# Patient Record
Sex: Female | Born: 1970 | Race: Black or African American | Hispanic: No | Marital: Single | Smoking: Never smoker
Health system: Southern US, Community
[De-identification: ages and names within clinical notes are randomized; demographics above are authoritative.]

---

## 2007-05-13 ENCOUNTER — Inpatient Hospital Stay (HOSPITAL_COMMUNITY): Admission: AD | Admit: 2007-05-13 | Discharge: 2007-05-13 | Payer: Self-pay | Admitting: Gynecology

## 2007-10-14 ENCOUNTER — Ambulatory Visit (HOSPITAL_COMMUNITY): Admission: RE | Admit: 2007-10-14 | Discharge: 2007-10-14 | Payer: Self-pay | Admitting: Obstetrics and Gynecology

## 2009-11-09 ENCOUNTER — Ambulatory Visit: Payer: Self-pay | Admitting: Diagnostic Radiology

## 2009-11-09 ENCOUNTER — Emergency Department (HOSPITAL_BASED_OUTPATIENT_CLINIC_OR_DEPARTMENT_OTHER): Admission: EM | Admit: 2009-11-09 | Discharge: 2009-11-09 | Payer: Self-pay | Admitting: Emergency Medicine

## 2010-04-25 ENCOUNTER — Ambulatory Visit (HOSPITAL_COMMUNITY)
Admission: RE | Admit: 2010-04-25 | Discharge: 2010-04-25 | Payer: Self-pay | Source: Home / Self Care | Attending: Internal Medicine | Admitting: Internal Medicine

## 2011-01-22 LAB — WET PREP, GENITAL: Clue Cells Wet Prep HPF POC: NONE SEEN

## 2011-01-22 LAB — URINALYSIS, ROUTINE W REFLEX MICROSCOPIC
Leukocytes, UA: NEGATIVE
Protein, ur: NEGATIVE
Specific Gravity, Urine: 1.025
Urobilinogen, UA: 0.2

## 2011-01-22 LAB — URINE MICROSCOPIC-ADD ON

## 2011-01-22 LAB — HCG, SERUM, QUALITATIVE: Preg, Serum: NEGATIVE

## 2011-01-22 LAB — POCT PREGNANCY, URINE: Preg Test, Ur: NEGATIVE

## 2011-01-22 LAB — GC/CHLAMYDIA PROBE AMP, GENITAL: Chlamydia, DNA Probe: NEGATIVE

## 2014-04-18 LAB — HEMOGLOBIN A1C: HEMOGLOBIN A1C, FINGERSTICK: 5.5

## 2014-04-19 LAB — DRUG SCREEN, URINE: Drug Screen, Urine: NEGATIVE

## 2014-04-19 LAB — OB RESULTS CONSOLE ABO/RH: RH Type: POSITIVE

## 2014-04-19 LAB — OB RESULTS CONSOLE PLATELET COUNT: Platelets: 230 10*3/uL

## 2014-04-19 LAB — OB RESULTS CONSOLE ANTIBODY SCREEN: Antibody Screen: NEGATIVE

## 2014-04-19 LAB — OB RESULTS CONSOLE HGB/HCT, BLOOD
HCT: 32 %
Hemoglobin: 10.4 g/dL

## 2014-04-19 LAB — OB RESULTS CONSOLE HEPATITIS B SURFACE ANTIGEN: Hepatitis B Surface Ag: NEGATIVE

## 2014-04-19 LAB — OB RESULTS CONSOLE RUBELLA ANTIBODY, IGM: Rubella: IMMUNE

## 2014-04-19 LAB — OB RESULTS CONSOLE GC/CHLAMYDIA
Chlamydia: NEGATIVE
Gonorrhea: NEGATIVE

## 2014-04-19 LAB — OB RESULTS CONSOLE HIV ANTIBODY (ROUTINE TESTING): HIV: NONREACTIVE

## 2014-04-19 LAB — OB RESULTS CONSOLE TSH: TSH: 2.007

## 2014-04-19 LAB — OB RESULTS CONSOLE RPR: RPR: NONREACTIVE

## 2014-06-27 ENCOUNTER — Encounter: Payer: Self-pay | Admitting: Physician Assistant

## 2014-07-02 LAB — GLUCOSE TOLERANCE, 1 HOUR (50G) W/O FASTING: GLUCOSE 1 HR PRENATAL, POC: 76 mg/dL

## 2014-07-02 LAB — OB RESULTS CONSOLE VARICELLA ZOSTER ANTIBODY, IGG: Varicella: IMMUNE

## 2014-07-02 LAB — OB RESULTS CONSOLE RPR: RPR: NONREACTIVE

## 2014-07-19 ENCOUNTER — Encounter: Payer: Self-pay | Admitting: Physician Assistant

## 2014-07-19 ENCOUNTER — Ambulatory Visit (INDEPENDENT_AMBULATORY_CARE_PROVIDER_SITE_OTHER): Payer: Medicaid Other | Admitting: Physician Assistant

## 2014-07-19 VITALS — BP 124/74 | HR 75 | Ht 66.0 in | Wt 175.2 lb

## 2014-07-19 DIAGNOSIS — Z9889 Other specified postprocedural states: Secondary | ICD-10-CM

## 2014-07-19 DIAGNOSIS — Z98891 History of uterine scar from previous surgery: Secondary | ICD-10-CM

## 2014-07-19 DIAGNOSIS — O34219 Maternal care for unspecified type scar from previous cesarean delivery: Secondary | ICD-10-CM | POA: Insufficient documentation

## 2014-07-19 DIAGNOSIS — O09523 Supervision of elderly multigravida, third trimester: Secondary | ICD-10-CM

## 2014-07-19 LAB — POCT URINALYSIS DIP (DEVICE)
Bilirubin Urine: NEGATIVE
Glucose, UA: NEGATIVE mg/dL
Ketones, ur: NEGATIVE mg/dL
Leukocytes, UA: NEGATIVE
Nitrite: NEGATIVE
Protein, ur: NEGATIVE mg/dL
Specific Gravity, Urine: 1.02 (ref 1.005–1.030)
Urobilinogen, UA: 0.2 mg/dL (ref 0.0–1.0)
pH: 6.5 (ref 5.0–8.0)

## 2014-07-19 NOTE — Patient Instructions (Signed)
Prenatal Care  WHAT IS PRENATAL CARE?  Prenatal care means health care during your pregnancy, before your baby is born. It is very important to take care of yourself and your baby during your pregnancy by:   Getting early prenatal care. If you know you are pregnant, or think you might be pregnant, call your health care provider as soon as possible. Schedule a visit for a prenatal exam.  Getting regular prenatal care. Follow your health care provider's schedule for blood and other necessary tests. Do not miss appointments.  Doing everything you can to keep yourself and your baby healthy during your pregnancy.  Getting complete care. Prenatal care should include evaluation of the medical, dietary, educational, psychological, and social needs of you and your significant other. The medical and genetic history of your family and the family of your baby's father should be discussed with your health care provider.  Discussing with your health care provider:  Prescription, over-the-counter, and herbal medicines that you take.  Any history of substance abuse, alcohol use, smoking, and illegal drug use.  Any history of domestic abuse and violence.  Immunizations you have received.  Your nutrition and diet.  The amount of exercise you do.  Any environmental and occupational hazards to which you are exposed.  History of sexually transmitted infections for both you and your partner.  Previous pregnancies you have had. WHY IS PRENATAL CARE SO IMPORTANT?  By regularly seeing your health care provider, you help ensure that problems can be identified early so that they can be treated as soon as possible. Other problems might be prevented. Many studies have shown that early and regular prenatal care is important for the health of mothers and their babies.  HOW CAN I TAKE CARE OF MYSELF WHILE I AM PREGNANT?  Here are ways to take care of yourself and your baby:   Start or continue taking your  multivitamin with 400 micrograms (mcg) of folic acid every day.  Get early and regular prenatal care. It is very important to see a health care provider during your pregnancy. Your health care provider will check at each visit to make sure that you and your baby are healthy. If there are any problems, action can be taken right away to help you and your baby.  Eat a healthy diet that includes:  Fruits.  Vegetables.  Foods low in saturated fat.  Whole grains.  Calcium-rich foods, such as milk, yogurt, and hard cheeses.  Drink 6-8 glasses of liquids a day.  Unless your health care provider tells you not to, try to be physically active for 30 minutes, most days of the week. If you are pressed for time, you can get your activity in through 10-minute segments, three times a day.  Do not smoke, drink alcohol, or use drugs. These can cause long-term damage to your baby. Talk with your health care provider about steps to take to stop smoking. Talk with a member of your faith community, a counselor, a trusted friend, or your health care provider if you are concerned about your alcohol or drug use.  Ask your health care provider before taking any medicine, even over-the-counter medicines. Some medicines are not safe to take during pregnancy.  Get plenty of rest and sleep.  Avoid hot tubs and saunas during pregnancy.  Do not have X-rays taken unless absolutely necessary and with the recommendation of your health care provider. A lead shield can be placed on your abdomen to protect your baby when   X-rays are taken in other parts of your body.  Do not empty the cat litter when you are pregnant. It may contain a parasite that causes an infection called toxoplasmosis, which can cause birth defects. Also, use gloves when working in garden areas used by cats.  Do not eat uncooked or undercooked meats or fish.  Do not eat soft, mold-ripened cheeses (Brie, Camembert, and chevre) or soft, blue-veined  cheese (Danish blue and Roquefort).  Stay away from toxic chemicals like:  Insecticides.  Solvents (some cleaners or paint thinners).  Lead.  Mercury.  Sexual intercourse may continue until the end of the pregnancy, unless you have a medical problem or there is a problem with the pregnancy and your health care provider tells you not to.  Do not wear high-heel shoes, especially during the second half of the pregnancy. You can lose your balance and fall.  Do not take long trips, unless absolutely necessary. Be sure to see your health care provider before going on the trip.  Do not sit in one position for more than 2 hours when on a trip.  Take a copy of your medical records when going on a trip. Know where a hospital is located in the city you are visiting, in case of an emergency.  Most dangerous household products will have pregnancy warnings on their labels. Ask your health care provider about products if you are unsure.  Limit or eliminate your caffeine intake from coffee, tea, sodas, medicines, and chocolate.  Many women continue working through pregnancy. Staying active might help you stay healthier. If you have a question about the safety or the hours you work at your particular job, talk with your health care provider.  Get informed:  Read books.  Watch videos.  Go to childbirth classes for you and your significant other.  Talk with experienced moms.  Ask your health care provider about childbirth education classes for you and your partner. Classes can help you and your partner prepare for the birth of your baby.  Ask about a baby doctor (pediatrician) and methods and pain medicine for labor, delivery, and possible cesarean delivery. HOW OFTEN SHOULD I SEE MY HEALTH CARE PROVIDER DURING PREGNANCY?  Your health care provider will give you a schedule for your prenatal visits. You will have visits more often as you get closer to the end of your pregnancy. An average  pregnancy lasts about 40 weeks.  A typical schedule includes visiting your health care provider:   About once each month during your first 6 months of pregnancy.  Every 2 weeks during the next 2 months.  Weekly in the last month, until the delivery date. Your health care provider will probably want to see you more often if:  You are older than 35 years.  Your pregnancy is high risk because you have certain health problems or problems with the pregnancy, such as:  Diabetes.  High blood pressure.  The baby is not growing on schedule, according to the dates of the pregnancy. Your health care provider will do special tests to make sure you and your baby are not having any serious problems. WHAT HAPPENS DURING PRENATAL VISITS?   At your first prenatal visit, your health care provider will do a physical exam and talk to you about your health history and the health history of your partner and your family. Your health care provider will be able to tell you what date to expect your baby to be born on.  Your   first physical exam will include checks of your blood pressure, measurements of your height and weight, and an exam of your pelvic organs. Your health care provider will do a Pap test if you have not had one recently and will do cultures of your cervix to make sure there is no infection.  At each prenatal visit, there will be tests of your blood, urine, blood pressure, weight, and the progress of the baby will be checked.  At your later prenatal visits, your health care provider will check how you are doing and how your baby is developing. You may have a number of tests done as your pregnancy progresses.  Ultrasound exams are often used to check on your baby's growth and health.  You may have more urine and blood tests, as well as special tests, if needed. These may include amniocentesis to examine fluid in the pregnancy sac, stress tests to check how the baby responds to contractions, or a  biophysical profile to measure your baby's well-being. Your health care provider will explain the tests and why they are necessary.  You should be tested for high blood sugar (gestational diabetes) between the 24th and 28th weeks of your pregnancy.  You should discuss with your health care provider your plans to breastfeed or bottle-feed your baby.  Each visit is also a chance for you to learn about staying healthy during pregnancy and to ask questions. Document Released: 04/23/2003 Document Revised: 04/25/2013 Document Reviewed: 07/05/2013 Exit[employer] Patient Information 2015 [employer]. This information is not intended to replace advice given to you by your health care provider. Make sure you discuss any questions you have with your health care provider.  

## 2014-07-19 NOTE — Progress Notes (Signed)
Initial visit-- patient reports being seen by Dr. Micah Noel and the [facility] Department-- states they did all blood work, pap smear and U/S along with glucose test; left there because she was unhappy with Dr. Micah Noel. Patient to sign ROI to request records.  New OB packet and 28 week packet given.

## 2014-07-19 NOTE — Progress Notes (Signed)
  Subjective:    Brooke Hampton is a G2P1001 [redacted]w[redacted]d being seen today for her first obstetrical visit.  Her obstetrical history is significant for advanced maternal age. Patient does intend to breast feed. Pregnancy history fully reviewed.  Patient reports no complaints.  Filed Vitals:   07/19/14 1323 07/19/14 1325  BP: 124/74   Pulse: 75   Height:  5\' 6"  (1.676 m)  Weight: 175 lb 3.2 oz (79.47 kg)     HISTORY: OB History  Gravida Para Term Preterm AB SAB TAB Ectopic Multiple Living  2 1 1  0 0     1    # Outcome Date GA Lbr Len/2nd Weight Sex Delivery Anes PTL Lv  2 Current           1 Term  [redacted]w[redacted]d  7 lb 7 oz (3.374 kg)  CS-Unspec        History reviewed. No pertinent past medical history. Past Surgical History  Procedure Laterality Date  . Cesarean section     History reviewed. No pertinent family history.   Exam    Uterus:   gravid                                 System:     Skin: normal coloration and turgor, no rashes    Neurologic: oriented, normal mood, grossly non-focal   Extremities: normal strength, tone, and muscle mass   HEENT extra ocular movement intact   Mouth/Teeth mucous membranes moist, pharynx normal without lesions and dental hygiene good   Neck supple and no masses   Cardiovascular: regular rate and rhythm, no murmurs or gallops   Respiratory:  appears well, vitals normal, no respiratory distress, acyanotic, normal RR, ear and throat exam is normal, neck free of mass or lymphadenopathy, chest clear, no wheezing, crepitations, rhonchi, normal symmetric air entry   Abdomen: soft, non-tender; bowel sounds normal; no masses,  no organomegaly          Assessment:    Pregnancy: G2P1001 Patient Active Problem List   Diagnosis Date Noted  . Supervision of high risk elderly multigravida in third trimester 07/19/2014  . H/O cesarean section 07/19/2014        Plan:    Desires TOLAC.  Consent reviewed and signed. Initial labs  drawn. Prenatal vitamins. Problem list reviewed and updated. Genetic Screening discussed Quad Screen: ordered.  Ultrasound discussed; fetal survey: ordered.  Follow up in 2 weeks. 90% of 45 min visit spent on counseling and coordination of care.   Records from early in pregnancy available from Dr. Micah Noel.  Still awaiting records from HD.    Paticia Stack 07/19/2014

## 2014-07-20 ENCOUNTER — Encounter: Payer: Self-pay | Admitting: *Deleted

## 2014-07-25 ENCOUNTER — Encounter: Payer: Self-pay | Admitting: *Deleted

## 2014-07-26 ENCOUNTER — Inpatient Hospital Stay (HOSPITAL_COMMUNITY)
Admission: AD | Admit: 2014-07-26 | Discharge: 2014-07-30 | DRG: 765 | Disposition: A | Discharge status: Home / Self Care | Payer: Medicaid Other | Source: Ambulatory Visit | Attending: Family Medicine | Admitting: Family Medicine

## 2014-07-26 ENCOUNTER — Other Ambulatory Visit: Payer: Self-pay | Admitting: Physician Assistant

## 2014-07-26 ENCOUNTER — Encounter: Payer: Self-pay | Admitting: Family Medicine

## 2014-07-26 ENCOUNTER — Encounter (HOSPITAL_COMMUNITY): Payer: Self-pay | Admitting: General Practice

## 2014-07-26 ENCOUNTER — Ambulatory Visit (HOSPITAL_COMMUNITY)
Admission: RE | Admit: 2014-07-26 | Discharge: 2014-07-26 | Disposition: A | Discharge status: Home / Self Care | Payer: Self-pay | Source: Ambulatory Visit | Attending: Physician Assistant | Admitting: Physician Assistant

## 2014-07-26 DIAGNOSIS — O09523 Supervision of elderly multigravida, third trimester: Secondary | ICD-10-CM

## 2014-07-26 DIAGNOSIS — O365930 Maternal care for other known or suspected poor fetal growth, third trimester, not applicable or unspecified: Secondary | ICD-10-CM | POA: Insufficient documentation

## 2014-07-26 DIAGNOSIS — O09529 Supervision of elderly multigravida, unspecified trimester: Secondary | ICD-10-CM | POA: Insufficient documentation

## 2014-07-26 DIAGNOSIS — Z3A31 31 weeks gestation of pregnancy: Secondary | ICD-10-CM | POA: Insufficient documentation

## 2014-07-26 DIAGNOSIS — Z3689 Encounter for other specified antenatal screening: Secondary | ICD-10-CM | POA: Insufficient documentation

## 2014-07-26 DIAGNOSIS — O365990 Maternal care for other known or suspected poor fetal growth, unspecified trimester, not applicable or unspecified: Secondary | ICD-10-CM | POA: Diagnosis present

## 2014-07-26 DIAGNOSIS — IMO0002 Reserved for concepts with insufficient information to code with codable children: Secondary | ICD-10-CM

## 2014-07-26 DIAGNOSIS — O365931 Maternal care for other known or suspected poor fetal growth, third trimester, fetus 1: Secondary | ICD-10-CM | POA: Diagnosis not present

## 2014-07-26 DIAGNOSIS — O3413 Maternal care for benign tumor of corpus uteri, third trimester: Secondary | ICD-10-CM | POA: Diagnosis present

## 2014-07-26 DIAGNOSIS — O4103X0 Oligohydramnios, third trimester, not applicable or unspecified: Secondary | ICD-10-CM | POA: Diagnosis present

## 2014-07-26 DIAGNOSIS — D259 Leiomyoma of uterus, unspecified: Secondary | ICD-10-CM | POA: Diagnosis present

## 2014-07-26 DIAGNOSIS — O4100X0 Oligohydramnios, unspecified trimester, not applicable or unspecified: Secondary | ICD-10-CM | POA: Insufficient documentation

## 2014-07-26 DIAGNOSIS — O34219 Maternal care for unspecified type scar from previous cesarean delivery: Secondary | ICD-10-CM | POA: Diagnosis present

## 2014-07-26 DIAGNOSIS — O3421 Maternal care for scar from previous cesarean delivery: Secondary | ICD-10-CM | POA: Diagnosis present

## 2014-07-26 DIAGNOSIS — O288 Other abnormal findings on antenatal screening of mother: Secondary | ICD-10-CM

## 2014-07-26 LAB — GROUP B STREP BY PCR: Group B strep by PCR: POSITIVE — AB

## 2014-07-26 LAB — CBC
HEMATOCRIT: 35.4 % — AB (ref 36.0–46.0)
Hemoglobin: 11.8 g/dL — ABNORMAL LOW (ref 12.0–15.0)
MCH: 29.5 pg (ref 26.0–34.0)
MCHC: 33.3 g/dL (ref 30.0–36.0)
MCV: 88.5 fL (ref 78.0–100.0)
Platelets: 191 10*3/uL (ref 150–400)
RBC: 4 MIL/uL (ref 3.87–5.11)
RDW: 13.4 % (ref 11.5–15.5)
WBC: 6.3 10*3/uL (ref 4.0–10.5)

## 2014-07-26 LAB — TYPE AND SCREEN
ABO/RH(D): A POS
Antibody Screen: NEGATIVE

## 2014-07-26 LAB — ABO/RH: ABO/RH(D): A POS

## 2014-07-26 LAB — OB RESULTS CONSOLE GBS: GBS: POSITIVE

## 2014-07-26 MED ORDER — ZOLPIDEM TARTRATE 5 MG PO TABS: 5.0000 mg | ORAL_TABLET | Freq: Every evening | ORAL | Status: DC | PRN

## 2014-07-26 MED ORDER — LIDOCAINE HCL 1 % IJ SOLN
INTRAMUSCULAR | Status: AC
  Filled 2014-07-26: qty 20

## 2014-07-26 MED ORDER — DOCUSATE SODIUM 100 MG PO CAPS: 100.0000 mg | ORAL_CAPSULE | Freq: Every day | ORAL | Status: DC

## 2014-07-26 MED ORDER — CALCIUM CARBONATE ANTACID 500 MG PO CHEW
2.0000 | CHEWABLE_TABLET | ORAL | Status: DC | PRN
  Administered 2014-07-26 – 2014-07-27 (×2): 400 mg via ORAL
  Filled 2014-07-26 (×2): qty 1

## 2014-07-26 MED ORDER — PRENATAL MULTIVITAMIN CH: 1.0000 | ORAL_TABLET | Freq: Every day | ORAL | Status: DC

## 2014-07-26 MED ORDER — BETAMETHASONE SOD PHOS & ACET 6 (3-3) MG/ML IJ SUSP
12.0000 mg | INTRAMUSCULAR | Status: DC
  Administered 2014-07-26: 12 mg via INTRAMUSCULAR
  Filled 2014-07-26 (×2): qty 2

## 2014-07-26 MED ORDER — ACETAMINOPHEN 325 MG PO TABS: 650.0000 mg | ORAL_TABLET | ORAL | Status: DC | PRN

## 2014-07-26 MED ORDER — LACTATED RINGERS IV SOLN
INTRAVENOUS | Status: DC
  Administered 2014-07-26 – 2014-07-27 (×7): via INTRAVENOUS

## 2014-07-26 MED ORDER — PRENATAL MULTIVITAMIN CH
1.0000 | ORAL_TABLET | Freq: Every day | ORAL | Status: DC
  Administered 2014-07-26 – 2014-07-27 (×2): 1 via ORAL
  Filled 2014-07-26 (×2): qty 1

## 2014-07-26 NOTE — H&P (Addendum)
  FACULTY PRACTICE ANTEPARTUM ADMISSION HISTORY AND PHYSICAL NOTE   History of Present Illness: Zi Sek is a 44 y.o. G2P1001 at [redacted]w[redacted]d admitted for IUGR and AEDF with oligohydramnios.  Patient reports the fetal movement as active. Patient reports uterine contraction  activity as none. Patient reports  vaginal bleeding as none. Patient describes fluid per vagina as None. Fetal presentation is cephalic.  Patient Active Problem List   Diagnosis Date Noted  . IUGR (intrauterine growth restriction) affecting care of mother 07/26/2014  . Advanced maternal age in multigravida   . Poor fetal growth affecting management of mother in third trimester, antepartum   . Supervision of high risk elderly multigravida in third trimester 07/19/2014  . H/O cesarean section 07/19/2014    No past medical history on file.  Past Surgical History  Procedure Laterality Date  . Cesarean section      OB History  Gravida Para Term Preterm AB SAB TAB Ectopic Multiple Living  2 1 1  0 0     1    # Outcome Date GA Lbr Len/2nd Weight Sex Delivery Anes PTL Lv  2 Current           1 Term  [redacted]w[redacted]d  7 lb 7 oz (3.374 kg)  CS-Unspec         History   Social History  . Marital Status: Married    Spouse Name: N/A  . Number of Children: N/A  . Years of Education: N/A   Social History Main Topics  . Smoking status: Never Smoker   . Smokeless tobacco: Never Used  . Alcohol Use: No  . Drug Use: No  . Sexual Activity: Yes   Other Topics Concern  . Not on file   Social History Narrative  . No narrative on file    No family history on file.  No Known Allergies  Prescriptions prior to admission  Medication Sig Dispense Refill Last Dose  . prenatal vitamin w/FE, FA (PRENATAL 1 + 1) 27-1 MG TABS tablet Take 1 tablet by mouth daily at 12 noon.   Taking    Review of Systems - 14 point review of systems is negative.  Vitals:  LMP 12/19/2013 Physical Examination: GENERAL: Well-developed,  well-nourished female in no acute distress.  LUNGS: normal effort HEART: Regular rate and rhythm. ABDOMEN: Gravid, non-tender.  Well healed transverse incision EXTREMITIES: No cyanosis, clubbing, or edema.  Tocometer: Flat  Labs:  No results found for this or any previous visit (from the past 24 hour(s)).  Imaging Studies: Vtx, AFI 5.8, EFW 2 lb 13 oz 18%-biometry is < 5 %, persisitent AEDF, multiple fibroids, possible placental infarct. Normal appearing anatomy, though limited.  Assessment and Plan: Patient Active Problem List   Diagnosis Date Noted  . IUGR (intrauterine growth restriction) affecting care of mother 07/26/2014  . Advanced maternal age in multigravida   . Poor fetal growth affecting management of mother in third trimester, antepartum   . Supervision of high risk elderly multigravida in third trimester 07/19/2014  . H/O cesarean section 07/19/2014   Admit to Antenatal Betamethasone x 2 doses Repeat Dopplers tomorrow with MFM NICU consult IVF's Pt. Initially wanted TOLAC, but fetal status may require RCS. If delivery seems imminent, would begin Magnesium Sulfate for CP prophylaxis. Continuous fetal monitoring. Routine antenatal care    PRATT,TANYA S 07/26/2014 12:17 PM

## 2014-07-27 ENCOUNTER — Observation Stay (HOSPITAL_COMMUNITY): Payer: Medicaid Other

## 2014-07-27 DIAGNOSIS — O365930 Maternal care for other known or suspected poor fetal growth, third trimester, not applicable or unspecified: Principal | ICD-10-CM

## 2014-07-27 DIAGNOSIS — O365931 Maternal care for other known or suspected poor fetal growth, third trimester, fetus 1: Secondary | ICD-10-CM | POA: Diagnosis not present

## 2014-07-27 DIAGNOSIS — O288 Other abnormal findings on antenatal screening of mother: Secondary | ICD-10-CM

## 2014-07-27 DIAGNOSIS — O289 Unspecified abnormal findings on antenatal screening of mother: Secondary | ICD-10-CM

## 2014-07-27 DIAGNOSIS — Z3A31 31 weeks gestation of pregnancy: Secondary | ICD-10-CM

## 2014-07-27 MED ORDER — MAGNESIUM SULFATE 40 G IN LACTATED RINGERS - SIMPLE
2.0000 g/h | INTRAVENOUS | Status: DC
  Filled 2014-07-27: qty 500

## 2014-07-27 MED ORDER — BETAMETHASONE SOD PHOS & ACET 6 (3-3) MG/ML IJ SUSP
12.0000 mg | INTRAMUSCULAR | Status: AC
  Administered 2014-07-27: 12 mg via INTRAMUSCULAR
  Filled 2014-07-27: qty 2

## 2014-07-27 MED ORDER — MAGNESIUM SULFATE BOLUS VIA INFUSION
4.0000 g | Freq: Once | INTRAVENOUS | Status: AC
  Administered 2014-07-27: 4 g via INTRAVENOUS
  Filled 2014-07-27: qty 500

## 2014-07-27 NOTE — Progress Notes (Signed)
MD in to talk to pt about plan of care

## 2014-07-27 NOTE — Progress Notes (Signed)
Pt continues in the bathroom completing her AM care

## 2014-07-27 NOTE — Progress Notes (Signed)
Patient ID: Brooke Hampton, female   DOB: 1970-06-23, 44 y.o.   MRN: [id number] [place]) NOTE  Brooke Hampton is a 44 y.o. G2P1001 at [redacted]w[redacted]d  who is admitted for bed rest secondary to IUGR with abnormal dopplers.    Fetal presentation is cephalic. Length of Stay:  1  Days  Date of admission:07/26/2014  Subjective: Patient reports feeling well without complaints.  Patient reports the fetal movement as active. Patient reports uterine contraction  activity as none. Patient reports  vaginal bleeding as none. Patient describes fluid per vagina as None.  Vitals:  Blood pressure 142/83, pulse 69, temperature 98.4 F (36.9 C), temperature source Oral, resp. rate 18, height 5\' 6"  (1.676 m), weight 175 lb (79.379 kg), last menstrual period 12/19/2013. Filed Vitals:   07/26/14 1226 07/26/14 1657 07/26/14 2004  BP: 122/62 131/85 142/83  Pulse: 64 73 69  Temp: 98.3 F (36.8 C) 98.4 F (36.9 C)   TempSrc: Oral Oral   Resp: 18 18 18   Height: 5\' 6"  (1.676 m)    Weight: 175 lb (79.379 kg)     Physical Examination:  General appearance - alert, well appearing, and in no distress Fundal Height:  size less than dates Pelvic Exam:  examination not indicated Cervical Exam: Not evaluated.  Extremities: extremities normal, atraumatic, no cyanosis or edema and Homans sign is negative, no sign of DVT with DTRs 2+ bilaterally Membranes:intact  Fetal Monitoring:  Baseline: 155 bpm, Variability: Good {> 6 bpm), Accelerations: Non-reactive but appropriate for gestational age, Decelerations: Variable: moderate spontaneous decels with return to baseline  and Toco: no contractions     Labs:  Results for orders placed or performed during the hospital encounter of 07/26/14 (from the past 24 hour(s))  Group B strep by PCR   Collection Time: 07/26/14 12:55 PM  Result Value Ref Range   Group B strep by PCR POSITIVE (A) NEGATIVE  CBC on admission   Collection Time: 07/26/14  1:10 PM   Result Value Ref Range   WBC 6.3 4.0 - 10.5 K/uL   RBC 4.00 3.87 - 5.11 MIL/uL   Hemoglobin 11.8 (L) 12.0 - 15.0 g/dL   HCT 35.4 (L) 36.0 - 46.0 %   MCV 88.5 78.0 - 100.0 fL   MCH 29.5 26.0 - 34.0 pg   MCHC 33.3 30.0 - 36.0 g/dL   RDW 13.4 11.5 - 15.5 %   Platelets 191 150 - 400 K/uL  Type and screen   Collection Time: 07/26/14  1:10 PM  Result Value Ref Range   ABO/RH(D) A POS    Antibody Screen NEG    Sample Expiration 07/29/2014   ABO/Rh   Collection Time: 07/26/14  1:10 PM  Result Value Ref Range   ABO/RH(D) A POS   OB RESULT CONSOLE Group B Strep   Collection Time: 07/26/14  6:59 PM  Result Value Ref Range   GBS Positive     Imaging Studies:    EFW 18%tile 1325 gm with persistent AEDF  Medications:  Scheduled . betamethasone acetate-betamethasone sodium phosphate  12 mg Intramuscular Q24H  . docusate sodium  100 mg Oral Daily  . prenatal multivitamin  1 tablet Oral QHS   I have reviewed the patient's current medications.  ASSESSMENT: G2P1001 [redacted]w[redacted]d Estimated Date of Delivery: 09/25/14  Patient Active Problem List   Diagnosis Date Noted  . IUGR (intrauterine growth restriction) affecting care of mother 07/26/2014  . Oligohydramnios antepartum 07/26/2014  . Advanced maternal age in multigravida   .  Supervision of high risk elderly multigravida in third trimester 07/19/2014  . Previous cesarean delivery affecting pregnancy, antepartum 07/19/2014    PLAN: 44 yo G2P1 at [redacted]w[redacted]d admitted with IUGR, oligo, and abnormal dopplers - Patient to receive second dose of BMZ today around 1pm - Follow up umbilical dopplers today - Continue close monitoring and current care - A total of 30 minutes was spent with the patient this morning explaining the plan of care  Benno Brensinger 07/27/2014,6:53 AM

## 2014-07-27 NOTE — Progress Notes (Signed)
Dr. Sarita Haver neonatologist at bedside  for consult

## 2014-07-27 NOTE — Consult Note (Signed)
Neonatology Consult to Antenatal Patient:  I was asked by Dr. Nehemiah Settle to see this patient in order to provide antenatal counseling due to FHR deceleration associated with intermittent REDF.  Brooke Hampton was admitted yesterday and is now 54 3/[redacted] weeks GA. She was admitted due to absent EDF, IUGR, and oligohydramnios. EFW is 1325 grams. The infant is a female. She is getting Betamethasone with the second dose due today at 1300. She is having intermittent REDF today, BPP 4/8, and a non-reassuring fetal monitor strip. A C-section has been recommended, but the patient reportedly does not want to do this. She has been given neuroprotective Magnesium sulfate this morning. There has been no labor.  I spoke with the patient and her husband. We discussed what they could expect if the baby is delivered today, including usual DR management, possible respiratory complications and need for support, IV access, feedings (mother desires breast and formula feeding), LOS, Mortality and Morbidity, and long term outcomes. I stressed the likelihood that the baby's outcome would be better with delivery now than to remain in the current in utero environment. They did not have any questions at this time. I offered a NICU tour to the father and would be glad to come back if they have more questions later.  Thank you for asking me to see this patient.  Real Cons, MD Neonatologist  The total length of face-to-face or floor/unit time for this encounter was 25 minutes. Counseling and/or coordination of care was 15 minutes of the above.

## 2014-07-27 NOTE — Progress Notes (Signed)
Spoke with patient - still having decelerations, though not as frequent.  Variability intermittently minimal to moderate.  No accelerations seen.  Discussed with the patient that this is still concerning.    I discussed that it was still my recommendation to deliver today, but patient still declining cesarean delivery.  Discontinue magnesium sulfate.  Will allow patient to eat.    Continue with continuous monitoring.  Truett Mainland, DO 07/27/2014 6:22 PM

## 2014-07-27 NOTE — Progress Notes (Addendum)
Patient ID: Brooke Hampton, female   DOB: 1970/11/11, 44 y.o.   MRN: [id number]  Talked with patient and her husband regarding BPP and umbilical artery dopplers.  Explained that due to absent and reverse end diastolic flow, combined with a BPP 4/8, and repetitive decelerations, the recommendation is to deliver due to the high risk of still birth.  The patient acknowledged the risk, but responded that she did not want to have the baby today.  She also said that she understood the severity of what was going on, but that she wanted to go home and talk with her family about this decision and then would return for delivery.  I explained that she would have to sign out against medical advise and that she should remain on continuous monitoring until she delivered.  I repeated that with the severity of the baby's status, I recommend proceeding to deliver via cesarean as soon as possible.    Will give the second dose of betamethasone early and start magnesium sulfate for neuro-protection.  NICU to come and talk with the patient and her husband.  Patient would like time to think about this.  Continuous monitoring.  Will f/u with patient later this morning.

## 2014-07-27 NOTE — Progress Notes (Signed)
Pt up and to bathroom, instructed pt that she needs to get to MFM in a timely manner and not off the monitor long.  Pt informed of FHR decels and the importance of monitoring FHR

## 2014-07-27 NOTE — Progress Notes (Signed)
Pt stating that she is not going to deliver at this time and requests to speak with her MD. Nehemiah Settle notified and will come to discuss plan of care.

## 2014-07-28 ENCOUNTER — Encounter (HOSPITAL_COMMUNITY)
Admission: AD | Disposition: A | Discharge status: Home / Self Care | Payer: Self-pay | Source: Ambulatory Visit | Attending: Family Medicine

## 2014-07-28 ENCOUNTER — Encounter (HOSPITAL_COMMUNITY): Payer: Self-pay | Admitting: Obstetrics & Gynecology

## 2014-07-28 ENCOUNTER — Observation Stay (HOSPITAL_COMMUNITY): Payer: Medicaid Other | Admitting: Anesthesiology

## 2014-07-28 DIAGNOSIS — O365930 Maternal care for other known or suspected poor fetal growth, third trimester, not applicable or unspecified: Secondary | ICD-10-CM | POA: Diagnosis present

## 2014-07-28 DIAGNOSIS — O09523 Supervision of elderly multigravida, third trimester: Secondary | ICD-10-CM | POA: Diagnosis not present

## 2014-07-28 DIAGNOSIS — Z3A31 31 weeks gestation of pregnancy: Secondary | ICD-10-CM | POA: Diagnosis present

## 2014-07-28 DIAGNOSIS — O3421 Maternal care for scar from previous cesarean delivery: Secondary | ICD-10-CM | POA: Diagnosis not present

## 2014-07-28 DIAGNOSIS — O4103X0 Oligohydramnios, third trimester, not applicable or unspecified: Secondary | ICD-10-CM | POA: Diagnosis present

## 2014-07-28 DIAGNOSIS — O3413 Maternal care for benign tumor of corpus uteri, third trimester: Secondary | ICD-10-CM | POA: Diagnosis present

## 2014-07-28 DIAGNOSIS — D259 Leiomyoma of uterus, unspecified: Secondary | ICD-10-CM | POA: Diagnosis present

## 2014-07-28 SURGERY — Surgical Case
Anesthesia: Spinal

## 2014-07-28 MED ORDER — KETOROLAC TROMETHAMINE 30 MG/ML IJ SOLN
30.0000 mg | Freq: Four times a day (QID) | INTRAMUSCULAR | Status: DC | PRN
  Administered 2014-07-28: 30 mg via INTRAVENOUS

## 2014-07-28 MED ORDER — ZOLPIDEM TARTRATE 5 MG PO TABS: 5.0000 mg | ORAL_TABLET | Freq: Every evening | ORAL | Status: DC | PRN

## 2014-07-28 MED ORDER — KETOROLAC TROMETHAMINE 30 MG/ML IJ SOLN: 30.0000 mg | Freq: Four times a day (QID) | INTRAMUSCULAR | Status: DC | PRN

## 2014-07-28 MED ORDER — FENTANYL CITRATE 0.05 MG/ML IJ SOLN
INTRAMUSCULAR | Status: AC
  Filled 2014-07-28: qty 2

## 2014-07-28 MED ORDER — DEXTROSE 5 % IV SOLN
2.0000 g | INTRAVENOUS | Status: AC
  Administered 2014-07-28: 2 g via INTRAVENOUS
  Filled 2014-07-28: qty 2

## 2014-07-28 MED ORDER — PRENATAL MULTIVITAMIN CH
1.0000 | ORAL_TABLET | Freq: Every day | ORAL | Status: DC
  Administered 2014-07-29 – 2014-07-30 (×2): 1 via ORAL
  Filled 2014-07-28 (×2): qty 1

## 2014-07-28 MED ORDER — SODIUM CHLORIDE 0.9 % IJ SOLN: 3.0000 mL | INTRAMUSCULAR | Status: DC | PRN

## 2014-07-28 MED ORDER — DIPHENHYDRAMINE HCL 25 MG PO CAPS
25.0000 mg | ORAL_CAPSULE | ORAL | Status: DC | PRN
  Filled 2014-07-28: qty 1

## 2014-07-28 MED ORDER — IBUPROFEN 600 MG PO TABS
600.0000 mg | ORAL_TABLET | Freq: Four times a day (QID) | ORAL | Status: DC
  Administered 2014-07-28 – 2014-07-30 (×7): 600 mg via ORAL
  Filled 2014-07-28 (×7): qty 1

## 2014-07-28 MED ORDER — DIPHENHYDRAMINE HCL 50 MG/ML IJ SOLN
12.5000 mg | INTRAMUSCULAR | Status: DC | PRN
  Administered 2014-07-28: 12.5 mg via INTRAVENOUS

## 2014-07-28 MED ORDER — MORPHINE SULFATE (PF) 0.5 MG/ML IJ SOLN
INTRAMUSCULAR | Status: DC | PRN
  Administered 2014-07-28: .1 mg via INTRATHECAL

## 2014-07-28 MED ORDER — SIMETHICONE 80 MG PO CHEW
80.0000 mg | CHEWABLE_TABLET | ORAL | Status: DC
  Administered 2014-07-30: 80 mg via ORAL
  Filled 2014-07-28 (×2): qty 1

## 2014-07-28 MED ORDER — CITRIC ACID-SODIUM CITRATE 334-500 MG/5ML PO SOLN
ORAL | Status: AC
  Administered 2014-07-28: 30 mL
  Filled 2014-07-28: qty 15

## 2014-07-28 MED ORDER — PHENYLEPHRINE 8 MG IN D5W 100 ML (0.08MG/ML) PREMIX OPTIME
INJECTION | INTRAVENOUS | Status: AC
  Filled 2014-07-28: qty 100

## 2014-07-28 MED ORDER — KETOROLAC TROMETHAMINE 30 MG/ML IJ SOLN
INTRAMUSCULAR | Status: AC
  Administered 2014-07-28: 30 mg via INTRAVENOUS
  Filled 2014-07-28: qty 1

## 2014-07-28 MED ORDER — WITCH HAZEL-GLYCERIN EX PADS: 1.0000 "application " | MEDICATED_PAD | CUTANEOUS | Status: DC | PRN

## 2014-07-28 MED ORDER — NALOXONE HCL 0.4 MG/ML IJ SOLN: 0.4000 mg | INTRAMUSCULAR | Status: DC | PRN

## 2014-07-28 MED ORDER — OXYCODONE-ACETAMINOPHEN 5-325 MG PO TABS
2.0000 | ORAL_TABLET | ORAL | Status: DC | PRN
  Administered 2014-07-30: 2 via ORAL
  Filled 2014-07-28: qty 2

## 2014-07-28 MED ORDER — SIMETHICONE 80 MG PO CHEW
80.0000 mg | CHEWABLE_TABLET | ORAL | Status: DC | PRN
  Administered 2014-07-29: 80 mg via ORAL
  Filled 2014-07-28: qty 1

## 2014-07-28 MED ORDER — FENTANYL CITRATE 0.05 MG/ML IJ SOLN
INTRAMUSCULAR | Status: DC | PRN
  Administered 2014-07-28: 12.5 ug via INTRATHECAL

## 2014-07-28 MED ORDER — LACTATED RINGERS IV SOLN: INTRAVENOUS | Status: DC

## 2014-07-28 MED ORDER — BUPIVACAINE HCL (PF) 0.5 % IJ SOLN
INTRAMUSCULAR | Status: DC | PRN
  Administered 2014-07-28: 30 mL

## 2014-07-28 MED ORDER — ONDANSETRON HCL 4 MG/2ML IJ SOLN
INTRAMUSCULAR | Status: AC
  Filled 2014-07-28: qty 2

## 2014-07-28 MED ORDER — DIBUCAINE 1 % RE OINT: 1.0000 "application " | TOPICAL_OINTMENT | RECTAL | Status: DC | PRN

## 2014-07-28 MED ORDER — OXYTOCIN 40 UNITS IN LACTATED RINGERS INFUSION - SIMPLE MED: 62.5000 mL/h | INTRAVENOUS | Status: AC

## 2014-07-28 MED ORDER — DIPHENHYDRAMINE HCL 25 MG PO CAPS
25.0000 mg | ORAL_CAPSULE | Freq: Four times a day (QID) | ORAL | Status: DC | PRN
  Filled 2014-07-28: qty 1

## 2014-07-28 MED ORDER — MEASLES, MUMPS & RUBELLA VAC ~~LOC~~ INJ
0.5000 mL | INJECTION | Freq: Once | SUBCUTANEOUS | Status: DC
Start: 2014-07-29 — End: 2014-07-30
  Filled 2014-07-28: qty 0.5

## 2014-07-28 MED ORDER — SCOPOLAMINE 1 MG/3DAYS TD PT72
MEDICATED_PATCH | TRANSDERMAL | Status: AC
  Filled 2014-07-28: qty 1

## 2014-07-28 MED ORDER — OXYTOCIN 10 UNIT/ML IJ SOLN
40.0000 [IU] | INTRAMUSCULAR | Status: DC | PRN
  Administered 2014-07-28: 40 [IU] via INTRAVENOUS

## 2014-07-28 MED ORDER — LACTATED RINGERS IV SOLN
INTRAVENOUS | Status: DC
  Administered 2014-07-28 (×3): via INTRAVENOUS

## 2014-07-28 MED ORDER — DEXTROSE 5 % IV SOLN: 1.0000 ug/kg/h | INTRAVENOUS | Status: DC | PRN

## 2014-07-28 MED ORDER — 0.9 % SODIUM CHLORIDE (POUR BTL) OPTIME
TOPICAL | Status: DC | PRN
  Administered 2014-07-28: 1000 mL

## 2014-07-28 MED ORDER — LACTATED RINGERS IV SOLN
INTRAVENOUS | Status: DC | PRN
  Administered 2014-07-28: 12:00:00 via INTRAVENOUS

## 2014-07-28 MED ORDER — LACTATED RINGERS IV SOLN
INTRAVENOUS | Status: DC
  Administered 2014-07-28: via INTRAVENOUS

## 2014-07-28 MED ORDER — ONDANSETRON HCL 4 MG/2ML IJ SOLN
INTRAMUSCULAR | Status: DC | PRN
  Administered 2014-07-28: 4 mg via INTRAVENOUS

## 2014-07-28 MED ORDER — ONDANSETRON HCL 4 MG/2ML IJ SOLN: 4.0000 mg | Freq: Three times a day (TID) | INTRAMUSCULAR | Status: DC | PRN

## 2014-07-28 MED ORDER — ACETAMINOPHEN 325 MG PO TABS: 650.0000 mg | ORAL_TABLET | ORAL | Status: DC | PRN

## 2014-07-28 MED ORDER — TETANUS-DIPHTH-ACELL PERTUSSIS 5-2.5-18.5 LF-MCG/0.5 IM SUSP: 0.5000 mL | Freq: Once | INTRAMUSCULAR | Status: DC

## 2014-07-28 MED ORDER — SIMETHICONE 80 MG PO CHEW
80.0000 mg | CHEWABLE_TABLET | Freq: Three times a day (TID) | ORAL | Status: DC
  Administered 2014-07-28 – 2014-07-30 (×4): 80 mg via ORAL
  Filled 2014-07-28 (×3): qty 1

## 2014-07-28 MED ORDER — MORPHINE SULFATE 0.5 MG/ML IJ SOLN
INTRAMUSCULAR | Status: AC
  Filled 2014-07-28: qty 10

## 2014-07-28 MED ORDER — BUPIVACAINE HCL (PF) 0.5 % IJ SOLN
INTRAMUSCULAR | Status: AC
  Filled 2014-07-28: qty 30

## 2014-07-28 MED ORDER — NALBUPHINE HCL 10 MG/ML IJ SOLN
5.0000 mg | INTRAMUSCULAR | Status: DC | PRN
  Administered 2014-07-28 (×2): 5 mg via INTRAVENOUS
  Filled 2014-07-28: qty 1

## 2014-07-28 MED ORDER — BUPIVACAINE IN DEXTROSE 0.75-8.25 % IT SOLN
INTRATHECAL | Status: DC | PRN
  Administered 2014-07-28: 10.5 mg via INTRATHECAL

## 2014-07-28 MED ORDER — LANOLIN HYDROUS EX OINT: 1.0000 "application " | TOPICAL_OINTMENT | CUTANEOUS | Status: DC | PRN

## 2014-07-28 MED ORDER — NALBUPHINE HCL 10 MG/ML IJ SOLN: 5.0000 mg | Freq: Once | INTRAMUSCULAR | Status: AC | PRN

## 2014-07-28 MED ORDER — DIPHENHYDRAMINE HCL 50 MG/ML IJ SOLN
INTRAMUSCULAR | Status: AC
  Filled 2014-07-28: qty 1

## 2014-07-28 MED ORDER — SCOPOLAMINE 1 MG/3DAYS TD PT72
MEDICATED_PATCH | TRANSDERMAL | Status: DC | PRN
  Administered 2014-07-28: 1 via TRANSDERMAL

## 2014-07-28 MED ORDER — MENTHOL 3 MG MT LOZG: 1.0000 | LOZENGE | OROMUCOSAL | Status: DC | PRN

## 2014-07-28 MED ORDER — SENNOSIDES-DOCUSATE SODIUM 8.6-50 MG PO TABS
2.0000 | ORAL_TABLET | ORAL | Status: DC
  Administered 2014-07-28 – 2014-07-30 (×2): 2 via ORAL
  Filled 2014-07-28 (×2): qty 2

## 2014-07-28 MED ORDER — NALBUPHINE HCL 10 MG/ML IJ SOLN
5.0000 mg | INTRAMUSCULAR | Status: DC | PRN
  Administered 2014-07-29: 5 mg via SUBCUTANEOUS
  Filled 2014-07-28 (×2): qty 1

## 2014-07-28 MED ORDER — OXYCODONE-ACETAMINOPHEN 5-325 MG PO TABS
1.0000 | ORAL_TABLET | ORAL | Status: DC | PRN
  Administered 2014-07-29 – 2014-07-30 (×4): 1 via ORAL
  Filled 2014-07-28 (×4): qty 1

## 2014-07-28 MED ORDER — FENTANYL CITRATE 0.05 MG/ML IJ SOLN: 25.0000 ug | INTRAMUSCULAR | Status: DC | PRN

## 2014-07-28 MED ORDER — PHENYLEPHRINE 8 MG IN D5W 100 ML (0.08MG/ML) PREMIX OPTIME
INJECTION | INTRAVENOUS | Status: DC | PRN
  Administered 2014-07-28: 60 ug/min via INTRAVENOUS

## 2014-07-28 MED ORDER — SCOPOLAMINE 1 MG/3DAYS TD PT72: 1.0000 | MEDICATED_PATCH | Freq: Once | TRANSDERMAL | Status: DC

## 2014-07-28 MED ORDER — MEPERIDINE HCL 25 MG/ML IJ SOLN: 6.2500 mg | INTRAMUSCULAR | Status: DC | PRN

## 2014-07-28 MED ORDER — OXYTOCIN 10 UNIT/ML IJ SOLN
INTRAMUSCULAR | Status: AC
  Filled 2014-07-28: qty 4

## 2014-07-28 SURGICAL SUPPLY — 28 items
BENZOIN TINCTURE PRP APPL 2/3 (GAUZE/BANDAGES/DRESSINGS) ×2 IMPLANT
CLAMP CORD UMBIL (MISCELLANEOUS) IMPLANT
CLOTH BEACON ORANGE TIMEOUT ST (SAFETY) ×2 IMPLANT
DRAPE SHEET LG 3/4 BI-LAMINATE (DRAPES) IMPLANT
DRSG OPSITE POSTOP 4X10 (GAUZE/BANDAGES/DRESSINGS) ×2 IMPLANT
DURAPREP 26ML APPLICATOR (WOUND CARE) ×2 IMPLANT
ELECT REM PT RETURN 9FT ADLT (ELECTROSURGICAL) ×2
ELECTRODE REM PT RTRN 9FT ADLT (ELECTROSURGICAL) ×1 IMPLANT
EXTRACTOR VACUUM M CUP 4 TUBE (SUCTIONS) IMPLANT
GLOVE BIOGEL PI IND STRL 7.0 (GLOVE) ×1 IMPLANT
GLOVE BIOGEL PI INDICATOR 7.0 (GLOVE) ×1
GLOVE ECLIPSE 7.0 STRL STRAW (GLOVE) ×4 IMPLANT
GOWN STRL REUS W/TWL LRG LVL3 (GOWN DISPOSABLE) ×4 IMPLANT
KIT ABG SYR 3ML LUER SLIP (SYRINGE) IMPLANT
NEEDLE HYPO 22GX1.5 SAFETY (NEEDLE) ×2 IMPLANT
NEEDLE HYPO 25X5/8 SAFETYGLIDE (NEEDLE) IMPLANT
NS IRRIG 1000ML POUR BTL (IV SOLUTION) ×2 IMPLANT
PACK C SECTION WH (CUSTOM PROCEDURE TRAY) ×2 IMPLANT
PAD OB MATERNITY 4.3X12.25 (PERSONAL CARE ITEMS) ×2 IMPLANT
RTRCTR C-SECT PINK 25CM LRG (MISCELLANEOUS) ×2 IMPLANT
STAPLER VISISTAT 35W (STAPLE) IMPLANT
STRIP CLOSURE SKIN 1/2X4 (GAUZE/BANDAGES/DRESSINGS) ×2 IMPLANT
SUT VIC AB 0 CTX 36 (SUTURE) ×3
SUT VIC AB 0 CTX36XBRD ANBCTRL (SUTURE) ×3 IMPLANT
SUT VIC AB 4-0 KS 27 (SUTURE) IMPLANT
SYR 30ML LL (SYRINGE) ×2 IMPLANT
TOWEL OR 17X24 6PK STRL BLUE (TOWEL DISPOSABLE) ×2 IMPLANT
TRAY FOLEY CATH 14FR (SET/KITS/TRAYS/PACK) ×2 IMPLANT

## 2014-07-28 NOTE — Transfer of Care (Signed)
Immediate Anesthesia Transfer of Care Note  Patient: Brooke Hampton  Procedure(s) Performed: Procedure(s): CESAREAN SECTION (N/A)  Patient Location: PACU  Anesthesia Type:Spinal  Level of Consciousness: awake  Airway & Oxygen Therapy: Patient Spontanous Breathing  Post-op Assessment: Report given to RN and Post -op Vital signs reviewed and stable  Post vital signs: stable  Last Vitals:  Filed Vitals:   07/28/14 1256  BP:   Pulse: 73  Temp: 36.9 C  Resp:     Complications: No apparent anesthesia complications

## 2014-07-28 NOTE — Progress Notes (Addendum)
[place] ANTEPARTUM COMPREHENSIVE PROGRESS NOTE  Brooke Hampton is a 44 y.o. G2P1001 at [redacted]w[redacted]d  who is admitted for IUGR.   Fetal presentation is cephalic. Length of Stay:  2  Days  Subjective: Pt reports that sh ewants a SVD but, ultimately wants a safe delivery for her and the baby.  She agrees to proceed with cesarean delivery.  Patient reports good fetal movement.  She reports no uterine contractions, no bleeding and no loss of fluid per vagina.  Vitals:  Blood pressure 115/80, pulse 74, temperature 98.1 F (36.7 C), temperature source Oral, resp. rate 18, height 5\' 6"  (1.676 m), weight 175 lb (79.379 kg), last menstrual period 12/19/2013, SpO2 100 %. Physical Examination: Pt in NAD Abd: gravid; well healed transverse incision.  Fetal Monitoring:  Baseline: 150's bpm, Variability: Fair (1-6 bpm), Accelerations: non-reactive and Decelerations: Variable: moderate  Labs:  No results found for this or any previous visit (from the past 24 hour(s)).  Imaging Studies:    3/25 intermittent absent and reverse end diastolic flow.  oligohydramnios.   Medications:  Scheduled . docusate sodium  100 mg Oral Daily  . prenatal multivitamin  1 tablet Oral QHS   I have reviewed the patient's current medications.  ASSESSMENT: Patient Active Problem List   Diagnosis Date Noted  . AFI (amniotic fluid index) decreased   . [redacted] weeks gestation of pregnancy   . IUGR (intrauterine growth restriction) affecting care of mother 07/26/2014  . Oligohydramnios antepartum 07/26/2014  . Advanced maternal age in multigravida   . Supervision of high risk elderly multigravida in third trimester 07/19/2014  . Previous cesarean delivery affecting pregnancy, antepartum 07/19/2014  IUGR with reverse flow.  Pt has now agreed to operative delivery.  She refuses to sign consent from until husband actually arrives.  He was notified of need to come and of pt agreeing to operative delivery.  The pt reports that he  is en route.   PLAN: The risks of cesarean section discussed with the patient included but were not limited to: bleeding which may require transfusion or reoperation; infection which may require antibiotics; injury to bowel, bladder, ureters or other surrounding organs; injury to the fetus; need for additional procedures including hysterectomy in the event of a life-threatening hemorrhage; placental abnormalities wth subsequent pregnancies, incisional problems, thromboembolic phenomenon and other postoperative/anesthesia complications. The patient concurred with the proposed plan, giving informed written consent for the procedure.   Patient has been NPO since 10pm.  She will remain NPO for procedure. Anesthesia and OR aware. Preoperative prophylactic antibiotics and SCDs ordered on call to the OR.  To OR when ready. I spent 30 min with patient reviewing status of fetus and need for delivery. All of her questions were answered.   HARRAWAY-SMITH, Brooke [place] 07/28/2014,10:33 AM

## 2014-07-28 NOTE — Op Note (Signed)
07/26/2014 - 07/28/2014  1:52 PM  PATIENT:  Brooke Hampton  44 y.o. female  PRE-OPERATIVE DIAGNOSIS:  repeat cesarean section for IUGR  POST-OPERATIVE DIAGNOSIS:  repeat cesarean section for IUGR  PROCEDURE:  Procedure(s): CESAREAN SECTION (N/A)  SURGEON:  Surgeon(s) and Role:    * Lavonia Drafts, MD - Primary  ANESTHESIA:   spinal  EBL:  Total I/O In: 2700 [I.V.:2700] Out: 850 [Urine:150; Blood:700]  BLOOD ADMINISTERED:none  DRAINS: none   LOCAL MEDICATIONS USED:  MARCAINE     SPECIMEN:  Source of Specimen:  placenta  DISPOSITION OF SPECIMEN:  PATHOLOGY  COUNTS:  YES  TOURNIQUET:  * No tourniquets in log *  DICTATION: .Note written in EPIC  PLAN OF CARE: Admit to inpatient   PATIENT DISPOSITION:  PACU - hemodynamically stable.   Delay start of Pharmacological VTE agent (>24hrs) due to surgical blood loss or risk of bleeding: yes  Complications: none immediate   INDICATIONS: Vira Chaplin is a 44 y.o. G2P1100 at [redacted]w[redacted]d here for cesarean section secondary to the indications listed under preoperative diagnosis; please see preoperative note for further details.  The risks of cesarean section were discussed with the patient including but were not limited to: bleeding which may require transfusion or reoperation; infection which may require antibiotics; injury to bowel, bladder, ureters or other surrounding organs; injury to the fetus; need for additional procedures including hysterectomy in the event of a life-threatening hemorrhage; placental abnormalities wth subsequent pregnancies, incisional problems, thromboembolic phenomenon and other postoperative/anesthesia complications.   The patient concurred with the proposed plan, giving informed written consent for the procedure.    FINDINGS:  Viable female infant in cephalic presentation.  Apgars and cord gas pending.  Clear amniotic fluid.  Intact placenta, three vessel cord.  Normal fallopian tubes and ovaries  bilaterally. 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   PROCEDURE IN DETAIL:  The patient preoperatively received intravenous antibiotics and had sequential compression devices applied to her lower extremities.  She was then taken to the operating room where spinal anesthesia was administered and was found to be adequate. She was then placed in a dorsal supine position with a leftward tilt, and prepped and draped in a sterile manner.  A foley catheter was placed into her bladder and attached to constant gravity.  After an adequate timeout was performed, a Pfannenstiel skin incision was made with scalpel and carried through to the underlying layer of fascia. The fascia was incised in the midline, and this incision was extended bilaterally using the Mayo scissors.  Kocher clamps were applied to the superior aspect of the fascial incision and the underlying rectus muscles were dissected off bluntly. A similar process was carried out on the inferior aspect of the fascial incision. The rectus muscles were separated in the midline bluntly and the peritoneum was entered bluntly. AN Alexis retractor was placed.  Attention was turned to the lower uterine segment where a low transverse hysterotomy incision was made with a scalpel and extended bilaterally bluntly.  The infant was successfully delivered, the cord was clamped and cut and the infant was handed over to awaiting neonatology team. Uterine massage was then administered, and the placenta delivered intact with a three-vessel cord. The uterus was then cleared of clot and debris.  The hysterotomy was closed with 0 Vicryl in a running locked fashion, and an imbricating layer was also placed with the same suture. The pelvis was cleared of all clot and debris. Hemostasis was confirmed on all surfaces.  The Alexis retractor was removed. The peritoneum and the muscles were reapproximated using 0 Vicryl in 2 layers. The fascia was then closed using 0 Vicryl in a  single suture.  The subcutaneous layer was irrigated, then reapproximated with 3-0 vicryl in a running locked fashion, and the skin was closed with a 4-0 Vicryl subcuticular stitch.  30 cc of 0.5% marcaine was injected into the incision and benzoin and steristrips were applied.  The patient tolerated the procedure well. Sponge, lap, instrument and needle counts were correct x 2.  She was taken to the recovery room in stable condition.

## 2014-07-28 NOTE — Progress Notes (Signed)
Anesthesia aware of high post partum hemorrhage risk; no further orders given at this time.

## 2014-07-28 NOTE — Progress Notes (Signed)
UR completed 

## 2014-07-28 NOTE — Anesthesia Procedure Notes (Signed)
Spinal Patient location during procedure: OR Staffing Anesthesiologist: Debra Colon Performed by: anesthesiologist  Preanesthetic Checklist Completed: patient identified, site marked, surgical consent, pre-op evaluation, timeout performed, IV checked, risks and benefits discussed and monitors and equipment checked Spinal Block Patient position: sitting Prep: Betadine Patient monitoring: heart rate, continuous pulse ox and blood pressure Approach: midline Location: L3-4 Injection technique: single-shot Needle Needle type: Sprotte  Needle gauge: 24 G Needle length: 9 cm Additional Notes Expiration date of kit checked and confirmed. Patient tolerated procedure well, without complications.     

## 2014-07-28 NOTE — Anesthesia Preprocedure Evaluation (Addendum)
Anesthesia Evaluation  Patient identified by MRN, date of birth, ID band Patient awake    Reviewed: Allergy & Precautions, NPO status , Patient's Chart, lab work & pertinent test results  Airway Mallampati: II  TM Distance: >3 FB Neck ROM: Full    Dental no notable dental hx.    Pulmonary neg pulmonary ROS,  breath sounds clear to auscultation  Pulmonary exam normal       Cardiovascular negative cardio ROS  Rhythm:Regular Rate:Normal     Neuro/Psych negative neurological ROS  negative psych ROS   GI/Hepatic negative GI ROS, Neg liver ROS,   Endo/Other  negative endocrine ROS  Renal/GU negative Renal ROS  negative genitourinary   Musculoskeletal negative musculoskeletal ROS (+)   Abdominal   Peds negative pediatric ROS (+)  Hematology negative hematology ROS (+)   Anesthesia Other Findings   Reproductive/Obstetrics (+) Pregnancy                            Anesthesia Physical Anesthesia Plan  ASA: II  Anesthesia Plan: Spinal   Post-op Pain Management:    Induction:   Airway Management Planned:   Additional Equipment:   Intra-op Plan:   Post-operative Plan:   Informed Consent: I have reviewed the patients History and Physical, chart, labs and discussed the procedure including the risks, benefits and alternatives for the proposed anesthesia with the patient or authorized representative who has indicated his/her understanding and acceptance.   Dental advisory given  Plan Discussed with: CRNA  Anesthesia Plan Comments:        Anesthesia Quick Evaluation

## 2014-07-28 NOTE — Brief Op Note (Signed)
07/26/2014 - 07/28/2014  1:52 PM  PATIENT:  Brooke Hampton  44 y.o. female  PRE-OPERATIVE DIAGNOSIS:  repeat cesarean section for IUGR  POST-OPERATIVE DIAGNOSIS:  repeat cesarean section for IUGR  PROCEDURE:  Procedure(s): CESAREAN SECTION (N/A)  SURGEON:  Surgeon(s) and Role:    * Lavonia Drafts, MD - Primary  ANESTHESIA:   spinal  EBL:  Total I/O In: 2700 [I.V.:2700] Out: 850 [Urine:150; Blood:700]  BLOOD ADMINISTERED:none  DRAINS: none   LOCAL MEDICATIONS USED:  MARCAINE     SPECIMEN:  Source of Specimen:  placenta  DISPOSITION OF SPECIMEN:  PATHOLOGY  COUNTS:  YES  TOURNIQUET:  * No tourniquets in log *  DICTATION: .Note written in EPIC  PLAN OF CARE: Admit to inpatient   PATIENT DISPOSITION:  PACU - hemodynamically stable.   Delay start of Pharmacological VTE agent (>24hrs) due to surgical blood loss or risk of bleeding: yes

## 2014-07-28 NOTE — Anesthesia Postprocedure Evaluation (Signed)
  Anesthesia Post-op Note  Patient: Brooke Hampton  Procedure(s) Performed: Procedure(s) (LRB): CESAREAN SECTION (N/A)  Patient Location: PACU  Anesthesia Type: Spinal  Level of Consciousness: awake and alert   Airway and Oxygen Therapy: Patient Spontanous Breathing  Post-op Pain: mild  Post-op Assessment: Post-op Vital signs reviewed, Patient's Cardiovascular Status Stable, Respiratory Function Stable, Patent Airway and No signs of Nausea or vomiting  Last Vitals:  Filed Vitals:   07/28/14 1700  BP: 132/84  Pulse: 56  Temp: 36.9 C  Resp: 18    Post-op Vital Signs: stable   Complications: No apparent anesthesia complications

## 2014-07-29 LAB — CBC
HEMATOCRIT: 26.2 % — AB (ref 36.0–46.0)
HEMOGLOBIN: 8.6 g/dL — AB (ref 12.0–15.0)
MCH: 29.6 pg (ref 26.0–34.0)
MCHC: 32.8 g/dL (ref 30.0–36.0)
MCV: 90 fL (ref 78.0–100.0)
PLATELETS: 196 10*3/uL (ref 150–400)
RBC: 2.91 MIL/uL — ABNORMAL LOW (ref 3.87–5.11)
RDW: 14.2 % (ref 11.5–15.5)
WBC: 13.3 10*3/uL — AB (ref 4.0–10.5)

## 2014-07-29 NOTE — Progress Notes (Signed)
Dr. Ihor Dow notified of pt incisional bleeding. Reported to have changed honeycomb dressing at 2130 100% saturated.  Assessed at midnight and dressing 75% saturated. Orders given to apply a pressure dressing. Will continue to monitor.

## 2014-07-29 NOTE — Lactation Note (Signed)
This note was copied from the chart of [place]. Lactation Consultation Note  Initial visit made.  Mom has been pumping and obtaining small amounts of milk.  Reviewed set up, cleaning and EBM storage.  Mom breastfed her first baby without difficulty. She does not have a pump for discharge and does not have [place].  She plans on calling [facility]IC in the morning to obtain appointment.  Encouraged to call for any concerns/assist.  Patient Name: Brooke Hampton NOMVE'H Date: 07/29/2014 Reason for consult: Initial assessment;NICU baby   Maternal Data    Feeding    LATCH Score/Interventions                      Lactation Tools Discussed/Used WIC Program: No Pump Review: Setup, frequency, and cleaning;Milk Storage Initiated by:: RN Date initiated:: 07/28/14   Consult Status Consult Status: Follow-up    Ave Filter 07/29/2014, 3:57 PM

## 2014-07-29 NOTE — Progress Notes (Signed)
Subjective: Postpartum Day 1: Cesarean Delivery Patient reports tolerating PO and no problems voiding.  Adequate pain control.   Objective: Vital signs in last 24 hours: Temp:  [98 F (36.7 C)-98.6 F (37 C)] 98.6 F (37 C) (03/27 0647) Pulse Rate:  [52-102] 102 (03/27 0647) Resp:  [14-20] 18 (03/27 0647) BP: (111-139)/(68-85) 123/69 mmHg (03/27 0647) SpO2:  [95 %-100 %] 100 % (03/27 0647)  Physical Exam:  General: alert and no distress Lochia: appropriate Uterine Fundus: firm Incision: dressing dry. DVT Evaluation: No evidence of DVT seen on physical exam.   Recent Labs  07/26/14 1310 07/29/14 0515  HGB 11.8* 8.6*  HCT 35.4* 26.2*    Assessment/Plan: Status post Cesarean section. Doing well postoperatively.  Continue current care.  HARRAWAY-SMITH, Alesandra Smart 07/29/2014, 7:39 AM

## 2014-07-29 NOTE — Addendum Note (Signed)
Addendum  created 07/29/14 1003 by Jonna Munro, CRNA   Modules edited: Notes Section   Notes Section:  File: [id number]

## 2014-07-29 NOTE — Anesthesia Postprocedure Evaluation (Signed)
  Anesthesia Post-op Note  Patient: Brooke Hampton  Procedure(s) Performed: Procedure(s): CESAREAN SECTION (N/A)  Patient Location: PACU  Anesthesia Type:Spinal  Level of Consciousness: awake, alert  and oriented  Airway and Oxygen Therapy: Patient Spontanous Breathing  Post-op Pain: none  Post-op Assessment: Post-op Vital signs reviewed, Patient's Cardiovascular Status Stable, Respiratory Function Stable, No signs of Nausea or vomiting, Pain level controlled, No headache, No backache, No residual numbness and No residual motor weakness  Post-op Vital Signs: Reviewed and stable  Last Vitals:  Filed Vitals:   07/29/14 0647  BP: 123/69  Pulse: 102  Temp: 37 C  Resp: 18    Complications: No apparent anesthesia complications

## 2014-07-30 ENCOUNTER — Encounter: Payer: Self-pay | Admitting: *Deleted

## 2014-07-30 ENCOUNTER — Encounter (HOSPITAL_COMMUNITY): Payer: Self-pay | Admitting: Obstetrics & Gynecology

## 2014-07-30 MED ORDER — OXYCODONE-ACETAMINOPHEN 5-325 MG PO TABS: 1.0000 | ORAL_TABLET | ORAL | Status: DC | PRN

## 2014-07-30 MED ORDER — IBUPROFEN 600 MG PO TABS: 600.0000 mg | ORAL_TABLET | Freq: Four times a day (QID) | ORAL | Status: DC

## 2014-07-30 NOTE — Progress Notes (Addendum)
This nurse was going over discharge instructions with pt and discussing incision care when pt stated she was concerned that her incision was still "bleeding".  Honeycomb dressing was partially saturated.  Dressing removed and pt asked for a doctor to "come see it" before she left. At 1320 called Dr.McEachern and she was in MAU seeing a pt and stated she would come when she finished down there. Told the pt that the doctor would come when she could.  Pt did not want to wait for the doctor.  I applied a new honeycomb dressing and discussed signs and symptoms of infection and if the bleeding got worse and not better to call the doctor or come back to the hospital to be seen.  Pt stated understanding and signed her discharge pt and left.  Dr. Angelica Chessman did come by to see the pt very soon after the pt had left. Pt left at 1330 and the Dr. Council Mechanic by around 1345.

## 2014-07-30 NOTE — Progress Notes (Signed)
CSW attempted to meet with MOB to offer support and complete assessment due to NICU admission, but she was not in her room on first attempt and she has already been discharged on second attempt.

## 2014-07-30 NOTE — Lactation Note (Signed)
This note was copied from the chart of [place]. Lactation Consultation Note  Patient Name: Brooke Hampton Date: 07/30/2014 Reason for consult: Follow-up assessment with this mom of a NICU baby, now 71 hours old, and 31 6/7 weeks CGA. MOm haas an abundant milk supply, pumping up to 8 ounces at a time.  I will loan mom and DEP when she comes back from the NICU, and instruct her in pump use and pumping at home, etc. Mom is being discharged to home today.    Maternal Data    Feeding Feeding Type: Breast Milk  LATCH Score/Interventions                      Lactation Tools Discussed/Used     Consult Status Consult Status: PRN Follow-up type: In-patient (NICU)    Tonna Corner 07/30/2014, 11:49 AM

## 2014-07-30 NOTE — Discharge Summary (Signed)
Obstetric Discharge Summary  Final Diagnosis Principal Problem:   IUGR (intrauterine growth restriction) affecting care of mother Active Problems:   Previous cesarean delivery affecting pregnancy, antepartum   Advanced maternal age in multigravida   Oligohydramnios antepartum  Reason for Admission: IUGR, abnormal dopplers and oligohydramnios Prenatal Procedures: NST, ultrasound and Betamethasone, Magnesium Sulfate for CP prophylaxis Intrapartum Procedures: cesarean: low cervical, transverse Postpartum Procedures: none Complications-Operative and Postpartum: none HEMOGLOBIN  Date Value Ref Range Status  07/29/2014 8.6* 12.0 - 15.0 g/dL Final  04/19/2014 10.4 g/dL Final   HCT  Date Value Ref Range Status  07/29/2014 26.2* 36.0 - 46.0 % Final  04/19/2014 32 % Final   Reason for admission: Brooke Hampton is a 44 y.o. G2P1001 at [redacted]w[redacted]d admitted for IUGR and AEDF with oligohydramnios  Hospital course: Patient admitted and received BMZ. Repeat Dopplers on HD #2 revealed REDF with recommendation for delivery.  NICU, MFM and OB saw pt. To recommend delivery, but pt. Reluctant, despite repetitive variable decels on monitor.  BMZ completed. Magnesium started for CP prophylaxis. On HD #3 pt allowed RLTCS. Postoperative care routine. Pt. Was afebrile, with stable vital signs and good UOP and stable Hgb. Pt. Discharged on POD #2.  Physical Exam:  General: alert, cooperative and appears stated age 72: appropriate Uterine Fundus: firm Incision: no significant drainage DVT Evaluation: No evidence of DVT seen on physical exam.  Discharge Diagnoses: Principal Problem:   IUGR (intrauterine growth restriction) affecting care of mother Active Problems:   Previous cesarean delivery affecting pregnancy, antepartum   Advanced maternal age in multigravida   Oligohydramnios antepartum   Discharge Information: Date: 07/30/2014 Activity: pelvic rest and no heavy lifting > 20 lbs x 6 wks Diet:  routine Medications: Ibuprofen and Percocet Condition: stable Instructions: see AVS Discharge to: home Follow-up Information    [facility] up with [facility] In 5 weeks.   Specialty:  Obstetrics and Gynecology   Why:  pp check, they will call you with appointmetn   Contact information:   [place] [phone]      Newborn Data: Live born female  Birth Weight: 2 lb 6.8 oz (1100 g) APGAR: 3, 6 Infant remains in NICU  Brooke Hampton S 07/30/2014, 7:43 AM

## 2014-07-30 NOTE — Discharge Instructions (Signed)
Breast Pumping Tips If you are breastfeeding, there may be times when you cannot feed your baby directly. Returning to work or going on a trip are examples. Pumping allows you to store breast milk and feed it to your baby later.  You may not get much milk when you first start to pump. Your breasts should start to make more after a few days. If you pump at the times you usually feed your baby, you may be able to keep making enough milk to feed your baby without also using formula. The more often you pump, the more milk your body will make. WHEN SHOULD I PUMP?   You can start to pump soon after you have your baby. Ask your doctor what is right for you and your baby.  If you are going back to work, start pumping a few weeks before. This gives you time to learn how to pump and to store a supply of milk.  When you are with your baby, feed your baby when he or she is hungry. Pump after each feeding.  When you are away from your baby for many hours, pump for about 15 minutes every 2-3 hours. Pump both breasts at the same time if you can.  If your baby has a formula feeding, make sure to pump close to the same time.  If you drink any alcohol, wait 2 hours before pumping. HOW DO I GET READY TO PUMP? Your let-down reflex is your body's natural reaction that makes your breast milk flow. It is easier to make your breast milk flow when you are relaxed. Try these things to help you relax:  Smell one of your baby's blankets or an item of clothing.  Look at a picture or video of your baby.  Sit in a quiet, private space.  Massage the breast you plan to pump.  Place soothing warmth on the breast.  Play relaxing music. WHAT ARE SOME BREAST PUMPING TIPS?  Wash your hands before you pump. You do not need to wash your nipples or breasts.  There are three ways to pump. You can:  Use your hand to massage and squeeze your breast.  Use a handheld manual pump.  Use an electric pump.  Make sure the  suction cup on the breast pump is the right size. Place the suction cup directly over the nipple. It can be painful or hurt your nipple if it is the wrong size or placed wrong.  Put a small amount of purified or modified lanolin on your nipple and areola if you are sore.  If you are using an electric pump, change the speed and suction power to be more comfortable.  You may need a different type of pump if pumping hurts or you do not get a lot of milk. Your doctor can help you pick what type of pump to use.  Keep a full water bottle near you always. Drinking lots of fluid helps you make more milk.  You can store your milk to use later. Pumped breast milk can be stored in a sealable, sterile container or plastic bag. Always put the date you pumped it on the container.  Milk can stay out at room temperature for up to 8 hours.  You can store your milk in the refrigerator for up to 8 days.  You can store your milk in the freezer for 3 months. Thaw frozen milk using warm water. Do not put it in the microwave.  Do not smoke.  Ask your doctor for help. WHEN SHOULD I CALL MY DOCTOR?  You have a hard time pumping.  You are worried you do not make enough milk.  You have nipple pain, soreness, or redness.  You want to take birth control pills. Document Released: 10/07/2007 Document Revised: 04/25/2013 Document Reviewed: 02/10/2013 [facility] Patient Information 2015 [place], [place]. This information is not intended to replace advice given to you by your health care provider. Make sure you discuss any questions you have with your health care provider. Contraception Choices Contraception (birth control) is the use of any methods or devices to prevent pregnancy. Below are some methods to help avoid pregnancy. HORMONAL METHODS   Contraceptive implant. This is a thin, plastic tube containing progesterone hormone. It does not contain estrogen hormone. Your health care provider inserts the tube in the  inner part of the upper arm. The tube can remain in place for up to 3 years. After 3 years, the implant must be removed. The implant prevents the ovaries from releasing an egg (ovulation), thickens the cervical mucus to prevent sperm from entering the uterus, and thins the lining of the inside of the uterus.  Progesterone-only injections. These injections are given every 3 months by your health care provider to prevent pregnancy. This synthetic progesterone hormone stops the ovaries from releasing eggs. It also thickens cervical mucus and changes the uterine lining. This makes it harder for sperm to survive in the uterus.  Birth control pills. These pills contain estrogen and progesterone hormone. They work by preventing the ovaries from releasing eggs (ovulation). They also cause the cervical mucus to thicken, preventing the sperm from entering the uterus. Birth control pills are prescribed by a health care provider.Birth control pills can also be used to treat heavy periods.  Minipill. This type of birth control pill contains only the progesterone hormone. They are taken every day of each month and must be prescribed by your health care provider.  Birth control patch. The patch contains hormones similar to those in birth control pills. It must be changed once a week and is prescribed by a health care provider.  Vaginal ring. The ring contains hormones similar to those in birth control pills. It is left in the vagina for 3 weeks, removed for 1 week, and then a new one is put back in place. The patient must be comfortable inserting and removing the ring from the vagina.A health care provider's prescription is necessary.  Emergency contraception. Emergency contraceptives prevent pregnancy after unprotected sexual intercourse. This pill can be taken right after sex or up to 5 days after unprotected sex. It is most effective the sooner you take the pills after having sexual intercourse. Most emergency  contraceptive pills are available without a prescription. Check with your pharmacist. Do not use emergency contraception as your only form of birth control. BARRIER METHODS   Female condom. This is a thin sheath (latex or rubber) that is worn over the penis during sexual intercourse. It can be used with spermicide to increase effectiveness.  Female condom. This is a soft, loose-fitting sheath that is put into the vagina before sexual intercourse.  Diaphragm. This is a soft, latex, dome-shaped barrier that must be fitted by a health care provider. It is inserted into the vagina, along with a spermicidal jelly. It is inserted before intercourse. The diaphragm should be left in the vagina for 6 to 8 hours after intercourse.  Cervical cap. This is a round, soft, latex or plastic cup that fits  over the cervix and must be fitted by a health care provider. The cap can be left in place for up to 48 hours after intercourse.  Sponge. This is a soft, circular piece of polyurethane foam. The sponge has spermicide in it. It is inserted into the vagina after wetting it and before sexual intercourse.  Spermicides. These are chemicals that kill or block sperm from entering the cervix and uterus. They come in the form of creams, jellies, suppositories, foam, or tablets. They do not require a prescription. They are inserted into the vagina with an applicator before having sexual intercourse. The process must be repeated every time you have sexual intercourse. INTRAUTERINE CONTRACEPTION  Intrauterine device (IUD). This is a T-shaped device that is put in a woman's uterus during a menstrual period to prevent pregnancy. There are 2 types:  Copper IUD. This type of IUD is wrapped in copper wire and is placed inside the uterus. Copper makes the uterus and fallopian tubes produce a fluid that kills sperm. It can stay in place for 10 years.  Hormone IUD. This type of IUD contains the hormone progestin (synthetic  progesterone). The hormone thickens the cervical mucus and prevents sperm from entering the uterus, and it also thins the uterine lining to prevent implantation of a fertilized egg. The hormone can weaken or kill the sperm that get into the uterus. It can stay in place for 3-5 years, depending on which type of IUD is used. PERMANENT METHODS OF CONTRACEPTION  Female tubal ligation. This is when the woman's fallopian tubes are surgically sealed, tied, or blocked to prevent the egg from traveling to the uterus.  Hysteroscopic sterilization. This involves placing a small coil or insert into each fallopian tube. Your doctor uses a technique called hysteroscopy to do the procedure. The device causes scar tissue to form. This results in permanent blockage of the fallopian tubes, so the sperm cannot fertilize the egg. It takes about 3 months after the procedure for the tubes to become blocked. You must use another form of birth control for these 3 months.  Female sterilization. This is when the female has the tubes that carry sperm tied off (vasectomy).This blocks sperm from entering the vagina during sexual intercourse. After the procedure, the man can still ejaculate fluid (semen). NATURAL PLANNING METHODS  Natural family planning. This is not having sexual intercourse or using a barrier method (condom, diaphragm, cervical cap) on days the woman could become pregnant.  Calendar method. This is keeping track of the length of each menstrual cycle and identifying when you are fertile.  Ovulation method. This is avoiding sexual intercourse during ovulation.  Symptothermal method. This is avoiding sexual intercourse during ovulation, using a thermometer and ovulation symptoms.  Post-ovulation method. This is timing sexual intercourse after you have ovulated. Regardless of which type or method of contraception you choose, it is important that you use condoms to protect against the transmission of sexually  transmitted infections (STIs). Talk with your health care provider about which form of contraception is most appropriate for you. Document Released: 04/20/2005 Document Revised: 04/25/2013 Document Reviewed: 10/13/2012 [facility] Patient Information 2015 [employer], [place]. This information is not intended to replace advice given to you by your health care provider. Make sure you discuss any questions you have with your health care provider. Cesarean Delivery  Cesarean delivery is the birth of a baby through a cut (incision) in the abdomen and womb (uterus).  LET [facility] CARE PROVIDER KNOW ABOUT:  All medicines  you are taking, including vitamins, herbs, eye drops, creams, and over-the-counter medicines.  Previous problems you or members of your family have had with the use of anesthetics.  Any blood disorders you have.  Previous surgeries you have had.  Medical conditions you have.  Any allergies you have.  Complicationsinvolving the pregnancy. RISKS AND COMPLICATIONS  Generally, this is a safe procedure. However, as with any procedure, complications can occur. Possible complications include:  Bleeding.  Infection.  Blood clots.  Injury to surrounding organs.  Problems with anesthesia.  Injury to the baby. BEFORE THE PROCEDURE   You may be given an antacid medicine to drink. This will prevent acid contents in your stomach from going into your lungs if you vomit during the surgery.  You may be given an antibiotic medicine to prevent infection. PROCEDURE   Hair may be removed from your pubic area and your lower abdomen. This is to prevent infection in the incision site.  A tube (Foley catheter) will be placed in your bladder to drain your urine from your bladder into a bag. This keeps your bladder empty during surgery.  An IV tube will be placed in your vein.  You may be given medicine to numb the lower half of your body (regional anesthetic). If you were in labor, you  may have already had an epidural in place which can be used in both labor and cesarean delivery. You may possibly be given medicine to make you sleep (general anesthetic) though this is not as common.  An incision will be made in your abdomen that extends to your uterus. There are 2 basic kinds of incisions:  The horizontal (transverse) incision. Horizontal incisions are from side to side and are used for most routine cesarean deliveries.  The vertical incision. The vertical incision is from the top of the abdomen to the bottom and is less commonly used. It is often done for women who have a serious complication (extreme prematurity) or under emergency situations.  The horizontal and vertical incisions may both be used at the same time. However, this is very uncommon.  An incision is then made in your uterus to deliver the baby.  Your baby will then be delivered.  Both incisions are then closed with absorbable stitches. AFTER THE PROCEDURE   If you were awake during the surgery, you will see your baby right away. If you were asleep, you will see your baby as soon as you are awake.  You may breastfeed your baby after surgery.  You may be able to get up and walk the same day as the surgery. If you need to stay in bed for a period of time, you will receive help to turn, cough, and take deep breaths after surgery. This helps prevent lung problems such as pneumonia.  Do not get out of bed alone the first time after surgery. You will need help getting out of bed until you are able to do this by yourself.  You may be able to shower the day after your cesarean delivery. After the bandage (dressing) is taken off the incision site, a nurse will assist you to shower if you would like help.  You will have pneumatic compression hose placed on your lower legs. This is done to prevent blood clots. When you are up and walking regularly, they will no longer be necessary.  Do not cross your legs when  you sit.  Save any blood clots that you pass. If you pass a clot  while on the toilet, do not flush it. Call for the nurse. Tell the nurse if you think you are bleeding too much or passing too many clots.  You will be given medicine as needed. Let your health care providers know if you are hurting. You may also be given an antibiotic to prevent an infection.  Your IV tube will be taken out when you are drinking a reasonable amount of fluids. The Foley catheter is taken out when you are up and walking.  If your blood type is Rh negative and your baby's blood type is Rh positive, you will be given a shot of anti-D immune globulin. This shot prevents you from having Rh problems with a future pregnancy. You should get the shot even if you had your tubes tied (tubal ligation).  If you are allowed to take the baby for a walk, place the baby in the bassinet and push it. Do not carry your baby in your arms. Document Released: 04/20/2005 Document Revised: 02/08/2013 Document Reviewed: 11/09/2012 [facility] Patient Information 2015 [place], [place]. This information is not intended to replace advice given to you by your health care provider. Make sure you discuss any questions you have with your health care provider.

## 2014-07-31 NOTE — Progress Notes (Signed)
Late Entry: Dr. Elly Modena at bedside reviewing strip and ultrasound with patient. Discussed risks continuous drops in fetal heart rate. Patient was not receptive to information.

## 2014-08-02 ENCOUNTER — Encounter: Payer: Self-pay | Admitting: Family Medicine

## 2014-08-04 ENCOUNTER — Encounter (HOSPITAL_COMMUNITY): Payer: Self-pay | Admitting: *Deleted

## 2014-08-04 ENCOUNTER — Inpatient Hospital Stay (HOSPITAL_COMMUNITY)
Admission: AD | Admit: 2014-08-04 | Discharge: 2014-08-04 | Disposition: A | Discharge status: Home / Self Care | Payer: Medicaid Other | Source: Ambulatory Visit | Attending: Obstetrics & Gynecology | Admitting: Obstetrics & Gynecology

## 2014-08-04 DIAGNOSIS — R208 Other disturbances of skin sensation: Secondary | ICD-10-CM | POA: Diagnosis not present

## 2014-08-04 DIAGNOSIS — G8918 Other acute postprocedural pain: Secondary | ICD-10-CM | POA: Insufficient documentation

## 2014-08-04 DIAGNOSIS — O9089 Other complications of the puerperium, not elsewhere classified: Secondary | ICD-10-CM | POA: Insufficient documentation

## 2014-08-04 DIAGNOSIS — L7682 Other postprocedural complications of skin and subcutaneous tissue: Secondary | ICD-10-CM

## 2014-08-04 DIAGNOSIS — D649 Anemia, unspecified: Secondary | ICD-10-CM | POA: Insufficient documentation

## 2014-08-04 DIAGNOSIS — K5900 Constipation, unspecified: Secondary | ICD-10-CM | POA: Insufficient documentation

## 2014-08-04 DIAGNOSIS — O9081 Anemia of the puerperium: Secondary | ICD-10-CM | POA: Insufficient documentation

## 2014-08-04 DIAGNOSIS — D509 Iron deficiency anemia, unspecified: Secondary | ICD-10-CM

## 2014-08-04 LAB — CBC
HCT: 24.7 % — ABNORMAL LOW (ref 36.0–46.0)
Hemoglobin: 7.8 g/dL — ABNORMAL LOW (ref 12.0–15.0)
MCH: 29.2 pg (ref 26.0–34.0)
MCHC: 31.6 g/dL (ref 30.0–36.0)
MCV: 92.5 fL (ref 78.0–100.0)
Platelets: 302 10*3/uL (ref 150–400)
RBC: 2.67 MIL/uL — ABNORMAL LOW (ref 3.87–5.11)
RDW: 15.7 % — ABNORMAL HIGH (ref 11.5–15.5)
WBC: 10.2 10*3/uL (ref 4.0–10.5)

## 2014-08-04 MED ORDER — KETOROLAC TROMETHAMINE 60 MG/2ML IM SOLN
60.0000 mg | Freq: Once | INTRAMUSCULAR | Status: AC
  Administered 2014-08-04: 60 mg via INTRAMUSCULAR
  Filled 2014-08-04: qty 2

## 2014-08-04 MED ORDER — POLYSACCHARIDE IRON COMPLEX 150 MG PO CAPS: 150.0000 mg | ORAL_CAPSULE | Freq: Every day | ORAL | Status: DC

## 2014-08-04 MED ORDER — DOCUSATE SODIUM 100 MG PO CAPS: 100.0000 mg | ORAL_CAPSULE | Freq: Two times a day (BID) | ORAL | Status: DC

## 2014-08-04 NOTE — MAU Note (Signed)
Pt states had C/S on 07/28/2014. Here for bleeding from incision. Had napkins over incision with at least silver dollar sized amount of blood on napkin. States is painful however hasn't taken anything for pain r/t percocet causing constipation.

## 2014-08-04 NOTE — MAU Provider Note (Signed)
History     CSN: [id number]  Arrival date and time: 08/04/14 1236   None     No chief complaint on file.  HPI  Pt is PO C/S 07/28/2014 for IUGR  at [place] 2 days- baby in NICU- pt is breast feeding/pumping Pt c/o of bleeding from incision Pt is tired and weak Pt has been constipation from Percocet and stopped taking due to constipation- Pt used enema yesterday and got results but had increase in bleeding  tylenol and ibuprofen do not work for pain Pt's lochia is mod RN note: Tinnie Gens, RN Registered Nurse Signed  MAU Note 08/04/2014 12:53 PM    Expand All Collapse All   Pt states had C/S on 07/28/2014. Here for bleeding from incision. Had napkins over incision with at least silver dollar sized amount of blood on napkin. States is painful however hasn't taken anything for pain r/t percocet causing constipation.       No past medical history on file.  Past Surgical History  Procedure Laterality Date  . Cesarean section    . Cesarean section N/A 07/28/2014    Procedure: CESAREAN SECTION;  Surgeon: Lavonia Drafts, MD;  Location: [facility];  Service: Obstetrics;  Laterality: N/A;    No family history on file.  History  Substance Use Topics  . Smoking status: Never Smoker   . Smokeless tobacco: Never Used  . Alcohol Use: No    Allergies: No Known Allergies  Prescriptions prior to admission  Medication Sig Dispense Refill Last Dose  . acetaminophen (TYLENOL) 500 MG tablet Take 1,000 mg by mouth every 6 (six) hours as needed for moderate pain.   Unknown at Unknown time  . Biotin 5000 MCG TABS Take 1 tablet by mouth daily.   Unknown at Unknown time  . ibuprofen (ADVIL,MOTRIN) 600 MG tablet Take 1 tablet (600 mg total) by mouth every 6 (six) hours. 30 tablet 0   . oxyCODONE-acetaminophen (PERCOCET/ROXICET) 5-325 MG per tablet Take 1 tablet by mouth every 4 (four) hours as needed (for pain scale 4-7). 40 tablet 0   . prenatal vitamin w/FE, FA (PRENATAL 1 + 1) 27-1 MG  TABS tablet Take 1 tablet by mouth daily at 12 noon.   Past Week at Unknown time    Review of Systems  Constitutional: Positive for chills and malaise/fatigue. Negative for fever.  Gastrointestinal: Positive for nausea, abdominal pain and constipation. Negative for vomiting and diarrhea.  Neurological: Positive for dizziness and weakness.   Physical Exam   Blood pressure 132/75, pulse 106, temperature 98.1 F (36.7 C), temperature source Oral, resp. rate 18, height 5\' 6"  (1.676 m), weight 163 lb 8 oz (74.163 kg), last menstrual period 12/19/2013, currently breastfeeding.  Physical Exam  Vitals reviewed. Constitutional: She is oriented to person, place, and time. She appears well-developed and well-nourished.  HENT:  Head: Normocephalic.  Eyes: Pupils are equal, round, and reactive to light.  Neck: Normal range of motion.  Cardiovascular: Normal rate.   Respiratory: Effort normal.  Breasts full bilaterally- no streaks, redness or tenderness noted  GI: Soft. She exhibits no distension. There is tenderness. There is guarding. There is no rebound.  Incision clean with steri strips- small amount of bleeding to right of middle incision- cleaned and redressed Fundus firm 2 FB below umbilicus- mildly tender  Musculoskeletal: Normal range of motion.  Neurological: She is alert and oriented to person, place, and time.  Skin: Skin is warm and dry.  Psychiatric: She has a normal  mood and affect.    MAU Course  Procedures Results for orders placed or performed during the hospital encounter of 08/04/14 (from the past 48 hour(s))  CBC     Status: Abnormal   Collection Time: 08/04/14  2:19 PM  Result Value Ref Range   WBC 10.2 4.0 - 10.5 K/uL   RBC 2.67 (L) 3.87 - 5.11 MIL/uL   Hemoglobin 7.8 (L) 12.0 - 15.0 g/dL   HCT 24.7 (L) 36.0 - 46.0 %   MCV 92.5 78.0 - 100.0 fL   MCH 29.2 26.0 - 34.0 pg   MCHC 31.6 30.0 - 36.0 g/dL   RDW 15.7 (H) 11.5 - 15.5 %   Platelets 302 150 - 400 K/uL   Toradol 60mg  IM given for pain conculted with Yvonne Kendall, CNM and Dr. Harolyn Rutherford consulted Assessment and Plan  surgical incision pain and bleeding- redressed- to return if increase in pain/bleeding Anemia- NuIron plus prenatal vitamin with food Constipation - colace 1 to 2 daily; diet and fluids discussed F/u with [facility] outpatient clinic at 5 week post op appointment  [facility] 08/04/2014, 1:13 PM

## 2014-08-11 ENCOUNTER — Inpatient Hospital Stay (HOSPITAL_COMMUNITY)
Admission: AD | Admit: 2014-08-11 | Discharge: 2014-08-11 | Disposition: A | Discharge status: Home / Self Care | Payer: Self-pay | Source: Ambulatory Visit | Attending: Obstetrics & Gynecology | Admitting: Obstetrics & Gynecology

## 2014-08-11 ENCOUNTER — Encounter (HOSPITAL_COMMUNITY): Payer: Self-pay | Admitting: Anesthesiology

## 2014-08-11 ENCOUNTER — Encounter (HOSPITAL_COMMUNITY): Payer: Self-pay | Admitting: Radiology

## 2014-08-11 ENCOUNTER — Inpatient Hospital Stay (HOSPITAL_COMMUNITY): Payer: Self-pay

## 2014-08-11 DIAGNOSIS — L7682 Other postprocedural complications of skin and subcutaneous tissue: Secondary | ICD-10-CM

## 2014-08-11 DIAGNOSIS — O902 Hematoma of obstetric wound: Secondary | ICD-10-CM | POA: Insufficient documentation

## 2014-08-11 DIAGNOSIS — S301XXD Contusion of abdominal wall, subsequent encounter: Secondary | ICD-10-CM

## 2014-08-11 LAB — CBC
HEMATOCRIT: 28.2 % — AB (ref 36.0–46.0)
HEMOGLOBIN: 9 g/dL — AB (ref 12.0–15.0)
MCH: 29.7 pg (ref 26.0–34.0)
MCHC: 31.9 g/dL (ref 30.0–36.0)
MCV: 93.1 fL (ref 78.0–100.0)
Platelets: 367 10*3/uL (ref 150–400)
RBC: 3.03 MIL/uL — ABNORMAL LOW (ref 3.87–5.11)
RDW: 16.5 % — AB (ref 11.5–15.5)
WBC: 6.7 10*3/uL (ref 4.0–10.5)

## 2014-08-11 MED ORDER — IOHEXOL 300 MG/ML  SOLN
100.0000 mL | Freq: Once | INTRAMUSCULAR | Status: AC | PRN
  Administered 2014-08-11: 100 mL via INTRAVENOUS

## 2014-08-11 MED ORDER — IOHEXOL 300 MG/ML  SOLN
50.0000 mL | Freq: Once | INTRAMUSCULAR | Status: AC | PRN
  Administered 2014-08-11: 50 mL via ORAL

## 2014-08-11 NOTE — MAU Note (Signed)
[place], CRNA notified IV attempts x3 by two RN's. Will come evaluate

## 2014-08-11 NOTE — MAU Note (Signed)
Pt here for c/s incision bleeding. Had just changed tissue that was placed over incision and  Was half saturated with serous sanguinous blood. States is darker at times. Also incisional pain rated 8/10.

## 2014-08-11 NOTE — Discharge Instructions (Signed)

## 2014-08-11 NOTE — MAU Note (Signed)
Notified Santiago Glad in radiology that patient finished with her PO contrast.  Santiago Glad to call CT at [facility] and let us know ETA.

## 2014-08-11 NOTE — MAU Provider Note (Signed)
History     CSN: [id number]  Arrival date and time: 08/11/14 1616   None     No chief complaint on file.  HPI G2P1100 S/P cesarean section who comes for the second time for bleeding from her incision.  She changes her dressing several time a day. No significant pain, no fever, no pus.  Pertinent Gynecological History: Menses: bleeding from incision after cesarean section 07/30/14 Bleeding: lochia Contraception: abstinence Blood transfusions: none Sexually transmitted diseases: no past history Previous GYN Procedures:        No past medical history on file.  Past Surgical History  Procedure Laterality Date  . Cesarean section    . Cesarean section N/A 07/28/2014    Procedure: CESAREAN SECTION;  Surgeon: Lavonia Drafts, MD;  Location: [facility];  Service: Obstetrics;  Laterality: N/A;    No family history on file.  History  Substance Use Topics  . Smoking status: Never Smoker   . Smokeless tobacco: Never Used  . Alcohol Use: No    Allergies: No Known Allergies  Prescriptions prior to admission  Medication Sig Dispense Refill Last Dose  . acetaminophen (TYLENOL) 500 MG tablet Take 1,000 mg by mouth every 6 (six) hours as needed for moderate pain.   prn  . Biotin 5000 MCG TABS Take 1 tablet by mouth daily.   Past Week at Unknown time  . docusate sodium (COLACE) 100 MG capsule Take 1 capsule (100 mg total) by mouth every 12 (twelve) hours. 60 capsule 0   . ibuprofen (ADVIL,MOTRIN) 600 MG tablet Take 1 tablet (600 mg total) by mouth every 6 (six) hours. 30 tablet 0 08/03/2014 at Unknown time  . iron polysaccharides (NIFEREX) 150 MG capsule Take 1 capsule (150 mg total) by mouth daily. 30 capsule 3   . oxyCODONE-acetaminophen (PERCOCET/ROXICET) 5-325 MG per tablet Take 1 tablet by mouth every 4 (four) hours as needed (for pain scale 4-7). 40 tablet 0 08/03/2014 at Unknown time    Review of Systems  Constitutional: Negative.   Respiratory: Negative.    Gastrointestinal:       Bleeding at incision   Physical Exam   Blood pressure 130/62, pulse 104, temperature 98.1 F (36.7 C), temperature source Oral, resp. rate 18, last menstrual period 12/19/2013, currently breastfeeding.  Physical Exam  Constitutional: She is oriented to person, place, and time. She appears well-developed. No distress.  Cardiovascular: Normal rate.   Respiratory: Effort normal.  GI: Soft. She exhibits mass (mass not tender below umbilicus, incision intact with dark blood at mid-portion).  Neurological: She is alert and oriented to person, place, and time.  Skin: No pallor.  Psychiatric: She has a normal mood and affect. Her behavior is normal.    MAU Course  Procedures  CLINICAL DATA: Drainage and pain from C sections site.  EXAM: CT ABDOMEN AND PELVIS WITHOUT CONTRAST  TECHNIQUE: Multidetector CT imaging of the abdomen and pelvis was performed following the standard protocol without IV contrast.  COMPARISON: None.  FINDINGS: Lower chest: No pleural effusion identified. Lung bases are clear.  Hepatobiliary: No suspicious liver abnormalities identified. The gallbladder appears normal. There is no biliary dilatation.  Pancreas: The pancreas is unremarkable.  Spleen: The spleen appears normal.  Adrenals/Urinary Tract: Normal adrenal glands. Normal appearance of both kidneys. The urinary bladder appears within normal limits.  Stomach/Bowel: The stomach and the small bowel loops appear within normal limits. No obstruction. The appendix is visualized and appears normal. Unremarkable appearance of the colon.  Vascular/Lymphatic: Normal appearance  of the abdominal aorta. No enlarged retroperitoneal or mesenteric adenopathy. No enlarged pelvic or inguinal lymph nodes.  Reproductive: Enlarged uterus compatible with early post partum period.  Other: Large rectus sheath hematoma at the level of the incision site is identified. This  measures 5.1 x 15.1 by 12.8 cm.  Musculoskeletal: Unremarkable.  IMPRESSION: 1. There is a large rectus sheath hematoma in this patient who is status post recent Cesarean section. Hematoma measures 5515 x 13 cm. 2. Enlarged uterus compatible with early postpartum time frame.   Electronically Signed  By: Kerby Moors M.D.  On: 08/11/2014 20:59   CBC    Component Value Date/Time   WBC 6.7 08/11/2014 1736   RBC 3.03* 08/11/2014 1736   HGB 9.0* 08/11/2014 1736   HGB 10.4 04/19/2014   HCT 28.2* 08/11/2014 1736   HCT 32 04/19/2014   PLT 367 08/11/2014 1736   PLT 230 04/19/2014   MCV 93.1 08/11/2014 1736   MCH 29.7 08/11/2014 1736   MCHC 31.9 08/11/2014 1736   RDW 16.5* 08/11/2014 1736      MDM moderate  Assessment and Plan  Right rectus sheath hematoma. Plan expectant management for now as this is draining and her hemoglobin is stable. F/U in 4 days at [facility].  ARNOLD,JAMES 08/11/2014, 5:13 PM

## 2014-08-16 ENCOUNTER — Ambulatory Visit: Payer: Self-pay | Admitting: Obstetrics & Gynecology

## 2014-08-17 ENCOUNTER — Encounter: Payer: Self-pay | Admitting: Obstetrics & Gynecology

## 2014-08-17 ENCOUNTER — Ambulatory Visit (INDEPENDENT_AMBULATORY_CARE_PROVIDER_SITE_OTHER): Payer: Self-pay | Admitting: Obstetrics & Gynecology

## 2014-08-17 VITALS — BP 111/67 | HR 80 | Temp 98.0°F | Ht 65.0 in | Wt 156.5 lb

## 2014-08-17 DIAGNOSIS — Z9889 Other specified postprocedural states: Secondary | ICD-10-CM

## 2014-08-17 DIAGNOSIS — S301XXD Contusion of abdominal wall, subsequent encounter: Secondary | ICD-10-CM

## 2014-08-17 MED ORDER — TRAMADOL HCL 50 MG PO TABS: 50.0000 mg | ORAL_TABLET | Freq: Four times a day (QID) | ORAL | Status: DC | PRN

## 2014-08-17 NOTE — Progress Notes (Signed)
Brooke Hampton is here for evaluation of post op hematoma.  She was  Very quiet today, did not make good eye contact, but denies any problems besides pain. States she is ok Per depression negative.

## 2014-08-17 NOTE — Progress Notes (Signed)
Subjective:     Patient ID: Brooke Hampton, female   DOB: [birth year], 44 y.o.   MRN: [id number]  HPI Pt is s/p c-section 07/28/2014.  Her post op course was complicated with pain and bleeding from the incision and a CT revealed a rectus sheath hematoma.  She presents today for f/u.       Review of Systems     Objective:   Physical Exam BP 111/67 mmHg  Pulse 80  Temp(Src) 98 F (36.7 C)  Ht 5\' 5"  (1.651 m)  Wt 156 lb 8 oz (70.988 kg)  BMI 26.04 kg/m2  LMP 12/19/2013  Breastfeeding? Yes Pt in NAD Abd; soft, ND, not appreciably tender- there is a firm area consistent with the hematoma ~10x12 cm.  The incision has some drainage of old blood.  No evidence of active bleeding.  No purulent drainage or erythema.      08/11/2014 CLINICAL DATA: Drainage and pain from C sections site.  EXAM: CT ABDOMEN AND PELVIS WITHOUT CONTRAST  TECHNIQUE: Multidetector CT imaging of the abdomen and pelvis was performed following the standard protocol without IV contrast.  COMPARISON: None.  FINDINGS: Lower chest: No pleural effusion identified. Lung bases are clear.  Hepatobiliary: No suspicious liver abnormalities identified. The gallbladder appears normal. There is no biliary dilatation.  Pancreas: The pancreas is unremarkable.  Spleen: The spleen appears normal.  Adrenals/Urinary Tract: Normal adrenal glands. Normal appearance of both kidneys. The urinary bladder appears within normal limits.  Stomach/Bowel: The stomach and the small bowel loops appear within normal limits. No obstruction. The appendix is visualized and appears normal. Unremarkable appearance of the colon.  Vascular/Lymphatic: Normal appearance of the abdominal aorta. No enlarged retroperitoneal or mesenteric adenopathy. No enlarged pelvic or inguinal lymph nodes.  Reproductive: Enlarged uterus compatible with early post partum period.  Other: Large rectus sheath hematoma at the level of the  incision site is identified. This measures 5.1 x 15.1 by 12.8 cm.  Musculoskeletal: Unremarkable.  IMPRESSION: 1. There is a large rectus sheath hematoma in this patient who is status post recent Cesarean section. Hematoma measures 5515 x 13 cm. 2. Enlarged uterus compatible with early postpartum time frame. Assessment:     3 week post op complicated by rectus sheath hematoma.  Pt is improving Constipation- due to Narcs.        Plan:     Continue to hold Percocet Ultram 50mg  prn #60 F/u in 2 weeks Keep incision clean and dry Miralax- reviewed usage. ,

## 2014-08-17 NOTE — Patient Instructions (Addendum)
Cesarean Delivery, Care After Refer to this sheet in the next few weeks. These instructions provide you with information on caring for yourself after your procedure. Your health care provider may also give you specific instructions. Your treatment has been planned according to current medical practices, but problems sometimes occur. Call your health care provider if you have any problems or questions after you go home. HOME CARE INSTRUCTIONS  Only take over-the-counter or prescription medications as directed by your health care provider.  Do not drink alcohol, especially if you are breastfeeding or taking medication to relieve pain.  Do not chew or smoke tobacco.  Continue to use good perineal care. Good perineal care includes:  Wiping your perineum from front to back.  Keeping your perineum clean.  Check your surgical cut (incision) daily for increased redness, drainage, swelling, or separation of skin.  Clean your incision gently with soap and water every day, and then pat it dry. If your health care provider says it is okay, leave the incision uncovered. Use a bandage (dressing) if the incision is draining fluid or appears irritated. If the adhesive strips across the incision do not fall off within 7 days, carefully peel them off.  Hug a pillow when coughing or sneezing until your incision is healed. This helps to relieve pain.  Do not use tampons or douche until your health care provider says it is okay.  Shower, wash your hair, and take tub baths as directed by your health care provider.  Wear a well-fitting bra that provides breast support.  Limit wearing support panties or control-top hose.  Drink enough fluids to keep your urine clear or pale yellow.  Eat high-fiber foods such as whole grain cereals and breads, brown rice, beans, and fresh fruits and vegetables every day. These foods may help prevent or relieve constipation.  Resume activities such as climbing stairs,  driving, lifting, exercising, or traveling as directed by your health care provider.  Talk to your health care provider about resuming sexual activities. This is dependent upon your risk of infection, your rate of healing, and your comfort and desire to resume sexual activity.  Try to have someone help you with your household activities and your newborn for at least a few days after you leave the hospital.  Rest as much as possible. Try to rest or take a nap when your newborn is sleeping.  Increase your activities gradually.  Keep all of your scheduled postpartum appointments. It is very important to keep your scheduled follow-up appointments. At these appointments, your health care provider will be checking to make sure that you are healing physically and emotionally. SEEK MEDICAL CARE IF:   You are passing large clots from your vagina. Save any clots to show your health care provider.  You have a foul smelling discharge from your vagina.  You have trouble urinating.  You are urinating frequently.  You have pain when you urinate.  You have a change in your bowel movements.  You have increasing redness, pain, or swelling near your incision.  You have pus draining from your incision.  Your incision is separating.  You have painful, hard, or reddened breasts.  You have a severe headache.  You have blurred vision or see spots.  You feel sad or depressed.  You have thoughts of hurting yourself or your newborn.  You have questions about your care, the care of your newborn, or medications.  You are dizzy or light-headed.  You have a rash.  You  have pain, redness, or swelling at the site of the removed intravenous access (IV) tube.  You have nausea or vomiting.  You stopped breastfeeding and have not had a menstrual period within 12 weeks of stopping.  You are not breastfeeding and have not had a menstrual period within 12 weeks of delivery.  You have a fever. SEEK  IMMEDIATE MEDICAL CARE IF:  You have persistent pain.  You have chest pain.  You have shortness of breath.  You faint.  You have leg pain.  You have stomach pain.  Your vaginal bleeding saturates 2 or more sanitary pads in 1 hour. MAKE SURE YOU:   Understand these instructions.  Will watch your condition.  Will get help right away if you are not doing well or get worse. Document Released: 01/10/2002 Document Revised: 09/04/2013 Document Reviewed: 12/16/2011 [facility] Patient Information 2015 [place], [place]. This information is not intended to replace advice given to you by your health care provider. Make sure you discuss any questions you have with your health care provider.   Miralax

## 2014-08-30 ENCOUNTER — Inpatient Hospital Stay (HOSPITAL_COMMUNITY)
Admission: AD | Admit: 2014-08-30 | Discharge: 2014-08-30 | Disposition: A | Discharge status: Home / Self Care | Payer: Medicaid Other | Source: Ambulatory Visit | Attending: Obstetrics & Gynecology | Admitting: Obstetrics & Gynecology

## 2014-08-30 ENCOUNTER — Encounter (HOSPITAL_COMMUNITY): Payer: Self-pay

## 2014-08-30 ENCOUNTER — Ambulatory Visit: Payer: Self-pay

## 2014-08-30 DIAGNOSIS — O9089 Other complications of the puerperium, not elsewhere classified: Secondary | ICD-10-CM | POA: Insufficient documentation

## 2014-08-30 DIAGNOSIS — L7682 Other postprocedural complications of skin and subcutaneous tissue: Secondary | ICD-10-CM

## 2014-08-30 DIAGNOSIS — R1032 Left lower quadrant pain: Secondary | ICD-10-CM | POA: Insufficient documentation

## 2014-08-30 DIAGNOSIS — R208 Other disturbances of skin sensation: Secondary | ICD-10-CM | POA: Diagnosis not present

## 2014-08-30 DIAGNOSIS — G8918 Other acute postprocedural pain: Secondary | ICD-10-CM | POA: Insufficient documentation

## 2014-08-30 NOTE — MAU Note (Signed)
Pt not in lobby.  

## 2014-08-30 NOTE — MAU Provider Note (Signed)
Chief Complaint: Wound Check   First Provider Initiated Contact with Patient 08/30/14 1847      SUBJECTIVE HPI: Brooke Hampton is a 44 y.o. G2P1101 s/p RLTCS 4 weeks ago who presents to maternity admissions reporting pain and leakage at incision site starting yesterday.  She reports some pink areas around incision since her surgery but a new red/pink area started yesterday on the left side of the incision causing pain and leaking light red fluid.  Her lochia stopped 1 week ago and she denies abdominal pain deeper than the incision site.  She denies vaginal bleeding, vaginal itching/burning, urinary symptoms, h/a, dizziness, n/v, or fever/chills.     Abdominal Pain This is a new problem. The current episode started yesterday. The onset quality is sudden. The problem occurs daily. The most recent episode lasted 1 day. The problem has been gradually worsening. The pain is located in the LLQ (at incision site). The pain is mild. The abdominal pain does not radiate. Pertinent negatives include no dysuria, fever, frequency, headaches, nausea or vomiting. She has tried nothing for the symptoms.    History reviewed. No pertinent past medical history. Past Surgical History  Procedure Laterality Date  . Cesarean section    . Cesarean section N/A 07/28/2014    Procedure: CESAREAN SECTION;  Surgeon: Lavonia Drafts, MD;  Location: [facility];  Service: Obstetrics;  Laterality: N/A;   History   Social History  . Marital Status: Single    Spouse Name: N/A  . Number of Children: N/A  . Years of Education: N/A   Occupational History  . Not on file.   Social History Main Topics  . Smoking status: Never Smoker   . Smokeless tobacco: Never Used  . Alcohol Use: No  . Drug Use: No  . Sexual Activity: Not Currently    Birth Control/ Protection: None   Other Topics Concern  . Not on file   Social History Narrative   No current facility-administered medications on file prior to encounter.    Current Outpatient Prescriptions on File Prior to Encounter  Medication Sig Dispense Refill  . acetaminophen (TYLENOL) 500 MG tablet Take 500-1,000 mg by mouth every 6 (six) hours as needed for moderate pain.     . Biotin 5000 MCG TABS Take 1 tablet by mouth daily.    Marland Kitchen docusate sodium (COLACE) 100 MG capsule Take 1 capsule (100 mg total) by mouth every 12 (twelve) hours. (Patient taking differently: Take 100-300 mg by mouth every 12 (twelve) hours as needed for mild constipation. ) 60 capsule 0  . ibuprofen (ADVIL,MOTRIN) 600 MG tablet Take 1 tablet (600 mg total) by mouth every 6 (six) hours. (Patient taking differently: Take 600 mg by mouth every 6 (six) hours as needed for moderate pain. ) 30 tablet 0  . iron polysaccharides (NIFEREX) 150 MG capsule Take 1 capsule (150 mg total) by mouth daily. 30 capsule 3  . oxyCODONE-acetaminophen (PERCOCET/ROXICET) 5-325 MG per tablet Take 1 tablet by mouth every 4 (four) hours as needed (for pain scale 4-7). 40 tablet 0  . traMADol (ULTRAM) 50 MG tablet Take 1 tablet (50 mg total) by mouth every 6 (six) hours as needed. 60 tablet 0   No Known Allergies  Review of Systems  Constitutional: Negative for fever, chills and malaise/fatigue.  Eyes: Negative for blurred vision.  Respiratory: Negative for cough and shortness of breath.   Cardiovascular: Negative for chest pain.  Gastrointestinal: Positive for abdominal pain. Negative for heartburn, nausea and vomiting.  Genitourinary: Negative for dysuria, urgency and frequency.  Musculoskeletal: Negative.   Neurological: Negative for dizziness and headaches.  Psychiatric/Behavioral: Negative for depression.    OBJECTIVE Blood pressure 127/78, pulse 80, temperature 98 F (36.7 C), temperature source Oral, resp. rate 20, weight 71.668 kg (158 lb), currently breastfeeding. GENERAL: Well-developed, well-nourished female in no acute distress.  EYES: normal sclera/conjunctiva; no lid-lag HENT: Atraumatic,  normocephalic HEART: normal rate RESP: normal effort ABDOMEN: Soft, non-tender, incision with granulation tissue with red/pink raised areas along incision site, location on left side of incision leaking serosanguinous fluid in small amounts.  Good approximation of incision, limited pain surrounding area, only painful to palpation at site with drainage MUSCULOSKELETAL: Normal ROM EXTREMITIES: Nontender, no edema NEURO/PSYCH: Alert and oriented, appropriate affect     IMAGING Ct Abdomen Pelvis Wo Contrast  08/11/2014   CLINICAL DATA:  Drainage and pain from C sections site.  EXAM: CT ABDOMEN AND PELVIS WITHOUT CONTRAST  TECHNIQUE: Multidetector CT imaging of the abdomen and pelvis was performed following the standard protocol without IV contrast.  COMPARISON:  None.  FINDINGS: Lower chest:  No pleural effusion identified.  Lung bases are clear.  Hepatobiliary: No suspicious liver abnormalities identified. The gallbladder appears normal. There is no biliary dilatation.  Pancreas: The pancreas is unremarkable.  Spleen: The spleen appears normal.  Adrenals/Urinary Tract: Normal adrenal glands. Normal appearance of both kidneys. The urinary bladder appears within normal limits.  Stomach/Bowel: The stomach and the small bowel loops appear within normal limits. No obstruction. The appendix is visualized and appears normal. Unremarkable appearance of the colon.  Vascular/Lymphatic: Normal appearance of the abdominal aorta. No enlarged retroperitoneal or mesenteric adenopathy. No enlarged pelvic or inguinal lymph nodes.  Reproductive: Enlarged uterus compatible with early post partum period.  Other: Large rectus sheath hematoma at the level of the incision site is identified. This measures 5.1 x 15.1 by 12.8 cm.  Musculoskeletal: Unremarkable.  IMPRESSION: 1. There is a large rectus sheath hematoma in this patient who is status post recent Cesarean section. Hematoma measures 5515 x 13 cm. 2. Enlarged uterus  compatible with early postpartum time frame.   Electronically Signed   By: Kerby Moors M.D.   On: 08/11/2014 20:59    ASSESSMENT 1. Pain at surgical incision     PLAN Dr Ihor Dow to bedside to see pt Discharge home F/U as scheduled for postpartum visit Return to MAU as needed for emergencies   Follow-up Information    Follow up with [facility].   Specialty:  Obstetrics and Gynecology   Why:  As scheduled for postpartum visit   Contact information:   [place] [phone]      Follow up with [facility].   Why:  As needed for emergencies   Contact information:   [address] [id number] [place] Certified Nurse-Midwife 08/30/2014  10:00 PM

## 2014-08-30 NOTE — MAU Note (Signed)
PP 07/28/14. Had c-section. Pt had a small area that is leaking from  Scar. C/o some increased pain as well. Started 2 days ago.

## 2014-08-30 NOTE — Discharge Instructions (Signed)
Incision Care °An incision is when a surgeon cuts into your body tissues. After surgery, the incision needs to be cared for properly to prevent infection.  °HOME CARE INSTRUCTIONS  °· Take all medicine as directed by your caregiver. Only take over-the-counter or prescription medicines for pain, discomfort, or fever as directed by your caregiver. °· Do not remove your bandage (dressing) or get your incision wet until your surgeon gives you permission. In the event that your dressing becomes wet, dirty, or starts to smell, change the dressing and call your surgeon for instructions as soon as possible. °· Take showers. Do not take tub baths, swim, or do anything that may soak the wound until it is healed. °· Resume your normal diet and activities as directed or allowed. °· Avoid lifting any weight until you are instructed otherwise. °· Use anti-itch antihistamine medicine as directed by your caregiver. The wound may itch when it is healing. Do not pick or scratch at the wound. °· Follow up with your caregiver for stitch (suture) or staple removal as directed. °· Drink enough fluids to keep your urine clear or pale yellow. °SEEK MEDICAL CARE IF:  °· You have redness, swelling, or increasing pain in the wound that is not controlled with medicine. °· You have drainage, blood, or pus coming from the wound that lasts longer than 1 day. °· You develop muscle aches, chills, or a general ill feeling. °· You notice a bad smell coming from the wound or dressing. °· Your wound edges separate after the sutures, staples, or skin adhesive strips have been removed. °· You develop persistent nausea or vomiting. °SEEK IMMEDIATE MEDICAL CARE IF:  °· You have a fever. °· You develop a rash. °· You develop dizzy episodes or faint while standing. °· You have difficulty breathing. °· You develop any reaction or side effects to medicine given. °MAKE SURE YOU:  °· Understand these instructions. °· Will watch your condition. °· Will get help  right away if you are not doing well or get worse. °Document Released: 11/07/2004 Document Revised: 07/13/2011 Document Reviewed: 06/14/2013 °[employer]® Patient Information ©2015 [employer]. This information is not intended to replace advice given to you by your health care provider. Make sure you discuss any questions you have with your health care provider. ° ° °

## 2014-09-03 ENCOUNTER — Encounter (HOSPITAL_COMMUNITY)
Admission: RE | Admit: 2014-09-03 | Discharge: 2014-09-03 | Disposition: A | Discharge status: Home / Self Care | Payer: Medicaid Other | Source: Ambulatory Visit | Attending: Family Medicine | Admitting: Family Medicine

## 2014-09-03 DIAGNOSIS — O923 Agalactia: Secondary | ICD-10-CM | POA: Insufficient documentation

## 2014-09-05 ENCOUNTER — Ambulatory Visit: Payer: Self-pay | Admitting: Obstetrics & Gynecology

## 2014-09-07 ENCOUNTER — Ambulatory Visit: Payer: Medicaid Other | Admitting: Family Medicine

## 2014-09-17 ENCOUNTER — Ambulatory Visit: Payer: Self-pay | Admitting: Family Medicine

## 2014-09-21 ENCOUNTER — Ambulatory Visit (INDEPENDENT_AMBULATORY_CARE_PROVIDER_SITE_OTHER): Payer: Self-pay | Admitting: Obstetrics & Gynecology

## 2014-09-21 ENCOUNTER — Encounter: Payer: Self-pay | Admitting: Obstetrics & Gynecology

## 2014-09-21 VITALS — BP 116/62 | HR 65 | Temp 98.2°F | Ht 66.0 in | Wt 152.4 lb

## 2014-09-21 DIAGNOSIS — Z30011 Encounter for initial prescription of contraceptive pills: Secondary | ICD-10-CM

## 2014-09-21 DIAGNOSIS — L929 Granulomatous disorder of the skin and subcutaneous tissue, unspecified: Secondary | ICD-10-CM

## 2014-09-21 MED ORDER — NAPROXEN 500 MG PO TABS: 500.0000 mg | ORAL_TABLET | Freq: Two times a day (BID) | ORAL | Status: AC

## 2014-09-21 MED ORDER — NORETHINDRONE 0.35 MG PO TABS: 1.0000 | ORAL_TABLET | Freq: Every day | ORAL | Status: AC

## 2014-09-21 NOTE — Progress Notes (Signed)
Patient ID: Brooke Hampton, female   DOB: 1970/06/14, 44 y.o.   MRN: [id number] Subjective:     Brooke Hampton is a 44 y.o. female who presents for a postpartum visit. She is 8 weeks postpartum following a low cervical transverse Cesarean section. I have fully reviewed the prenatal and intrapartum course. The delivery was at 31.4 gestational weeks. Outcome: repeat cesarean section, low transverse incision. Anesthesia: spinal. Postpartum course has been complicated with hematoma of abd wall. Baby's course has been complicated with prolonged NICU stay.  Infant has been home for 1 1/2 weeks now. Baby is feeding by both breast and bottle - Similac Neosure. Bleeding no bleeding. Bowel function is normal. Bladder function is normal. Patient is sexually active. Contraception method is Nexplanon. Postpartum depression screening: negative. Pt reports continued pain in abd that is improved since surgery.  The following portions of the patient's history were reviewed and updated as appropriate: allergies, current medications, past family history, past medical history, past social history, past surgical history and problem list.  Review of Systems A comprehensive review of systems was negative.   Objective:  BP 116/62 mmHg  Pulse 65  Temp(Src) 98.2 F (36.8 C) (Oral)  Ht 5\' 6"  (1.676 m)  Wt 152 lb 6.4 oz (69.128 kg)  BMI 24.61 kg/m2  Breastfeeding? Yes Pt in NAD Abd: incision- there is a large area of granulation tissue on the left edge of the incision. This was treated with silver nitrate         Assessment:     8 weeks postpartum exam.  Granulation tissue of incision treated today Contraception counseling- pt will use POPs for now.  Undecided re need for contracpetion  Plan:    1. Contraception:POPs 2.  Follow up in: 2 weeks to reeval granulation tissue of incision or as needed.

## 2014-09-21 NOTE — Patient Instructions (Signed)
Contraception Choices Contraception (birth control) is the use of any methods or devices to prevent pregnancy. Below are some methods to help avoid pregnancy. HORMONAL METHODS   Contraceptive implant. This is a thin, plastic tube containing progesterone hormone. It does not contain estrogen hormone. Your health care provider inserts the tube in the inner part of the upper arm. The tube can remain in place for up to 3 years. After 3 years, the implant must be removed. The implant prevents the ovaries from releasing an egg (ovulation), thickens the cervical mucus to prevent sperm from entering the uterus, and thins the lining of the inside of the uterus.  Progesterone-only injections. These injections are given every 3 months by your health care provider to prevent pregnancy. This synthetic progesterone hormone stops the ovaries from releasing eggs. It also thickens cervical mucus and changes the uterine lining. This makes it harder for sperm to survive in the uterus.  Birth control pills. These pills contain estrogen and progesterone hormone. They work by preventing the ovaries from releasing eggs (ovulation). They also cause the cervical mucus to thicken, preventing the sperm from entering the uterus. Birth control pills are prescribed by a health care provider.Birth control pills can also be used to treat heavy periods.  Minipill. This type of birth control pill contains only the progesterone hormone. They are taken every day of each month and must be prescribed by your health care provider.  Birth control patch. The patch contains hormones similar to those in birth control pills. It must be changed once a week and is prescribed by a health care provider.  Vaginal ring. The ring contains hormones similar to those in birth control pills. It is left in the vagina for 3 weeks, removed for 1 week, and then a new one is put back in place. The patient must be comfortable inserting and removing the ring  from the vagina.A health care provider's prescription is necessary.  Emergency contraception. Emergency contraceptives prevent pregnancy after unprotected sexual intercourse. This pill can be taken right after sex or up to 5 days after unprotected sex. It is most effective the sooner you take the pills after having sexual intercourse. Most emergency contraceptive pills are available without a prescription. Check with your pharmacist. Do not use emergency contraception as your only form of birth control. BARRIER METHODS   Female condom. This is a thin sheath (latex or rubber) that is worn over the penis during sexual intercourse. It can be used with spermicide to increase effectiveness.  Female condom. This is a soft, loose-fitting sheath that is put into the vagina before sexual intercourse.  Diaphragm. This is a soft, latex, dome-shaped barrier that must be fitted by a health care provider. It is inserted into the vagina, along with a spermicidal jelly. It is inserted before intercourse. The diaphragm should be left in the vagina for 6 to 8 hours after intercourse.  Cervical cap. This is a round, soft, latex or plastic cup that fits over the cervix and must be fitted by a health care provider. The cap can be left in place for up to 48 hours after intercourse.  Sponge. This is a soft, circular piece of polyurethane foam. The sponge has spermicide in it. It is inserted into the vagina after wetting it and before sexual intercourse.  Spermicides. These are chemicals that kill or block sperm from entering the cervix and uterus. They come in the form of creams, jellies, suppositories, foam, or tablets. They do not require a   prescription. They are inserted into the vagina with an applicator before having sexual intercourse. The process must be repeated every time you have sexual intercourse. INTRAUTERINE CONTRACEPTION  Intrauterine device (IUD). This is a T-shaped device that is put in a woman's uterus  during a menstrual period to prevent pregnancy. There are 2 types:  Copper IUD. This type of IUD is wrapped in copper wire and is placed inside the uterus. Copper makes the uterus and fallopian tubes produce a fluid that kills sperm. It can stay in place for 10 years.  Hormone IUD. This type of IUD contains the hormone progestin (synthetic progesterone). The hormone thickens the cervical mucus and prevents sperm from entering the uterus, and it also thins the uterine lining to prevent implantation of a fertilized egg. The hormone can weaken or kill the sperm that get into the uterus. It can stay in place for 3-5 years, depending on which type of IUD is used. PERMANENT METHODS OF CONTRACEPTION  Female tubal ligation. This is when the woman's fallopian tubes are surgically sealed, tied, or blocked to prevent the egg from traveling to the uterus.  Hysteroscopic sterilization. This involves placing a small coil or insert into each fallopian tube. Your doctor uses a technique called hysteroscopy to do the procedure. The device causes scar tissue to form. This results in permanent blockage of the fallopian tubes, so the sperm cannot fertilize the egg. It takes about 3 months after the procedure for the tubes to become blocked. You must use another form of birth control for these 3 months.  Female sterilization. This is when the female has the tubes that carry sperm tied off (vasectomy).This blocks sperm from entering the vagina during sexual intercourse. After the procedure, the man can still ejaculate fluid (semen). NATURAL PLANNING METHODS  Natural family planning. This is not having sexual intercourse or using a barrier method (condom, diaphragm, cervical cap) on days the woman could become pregnant.  Calendar method. This is keeping track of the length of each menstrual cycle and identifying when you are fertile.  Ovulation method. This is avoiding sexual intercourse during ovulation.  Symptothermal  method. This is avoiding sexual intercourse during ovulation, using a thermometer and ovulation symptoms.  Post-ovulation method. This is timing sexual intercourse after you have ovulated. Regardless of which type or method of contraception you choose, it is important that you use condoms to protect against the transmission of sexually transmitted infections (STIs). Talk with your health care provider about which form of contraception is most appropriate for you. Document Released: 04/20/2005 Document Revised: 04/25/2013 Document Reviewed: 10/13/2012 Exit[employer] Patient Information 2015 [employer]. This information is not intended to replace advice given to you by your health care provider. Make sure you discuss any questions you have with your health care provider.  

## 2014-09-26 ENCOUNTER — Ambulatory Visit: Payer: Self-pay | Admitting: Obstetrics & Gynecology

## 2015-05-22 ENCOUNTER — Encounter: Payer: Self-pay | Admitting: General Practice

## 2015-05-22 ENCOUNTER — Other Ambulatory Visit: Payer: Self-pay

## 2015-05-22 ENCOUNTER — Ambulatory Visit (INDEPENDENT_AMBULATORY_CARE_PROVIDER_SITE_OTHER): Payer: Self-pay | Admitting: General Practice

## 2015-05-22 DIAGNOSIS — Z3201 Encounter for pregnancy test, result positive: Secondary | ICD-10-CM

## 2015-05-22 LAB — POCT PREGNANCY, URINE: Preg Test, Ur: POSITIVE — AB

## 2015-05-22 NOTE — Progress Notes (Signed)
Patient here today for upt. upt positive. Patient reports first positive upt 05/14/15. Patient's LMP is 04/13/15. [redacted]w[redacted]d edd 01/18/16. Patient informed. Patient requests to start care here. New ob packet given. Patient declines labs today and wishes to get at new OB appt. Patient had no questions. Pregnancy verification letter given.

## 2015-06-26 ENCOUNTER — Encounter: Payer: Self-pay | Admitting: Student

## 2015-06-26 ENCOUNTER — Ambulatory Visit (HOSPITAL_COMMUNITY)
Admission: RE | Admit: 2015-06-26 | Discharge: 2015-06-26 | Disposition: A | Discharge status: Home / Self Care | Payer: BLUE CROSS/BLUE SHIELD | Source: Ambulatory Visit | Attending: Student | Admitting: Student

## 2015-06-26 ENCOUNTER — Ambulatory Visit (INDEPENDENT_AMBULATORY_CARE_PROVIDER_SITE_OTHER): Payer: Self-pay | Admitting: Student

## 2015-06-26 VITALS — BP 110/47 | HR 88 | Temp 98.4°F | Wt 170.9 lb

## 2015-06-26 DIAGNOSIS — Z3A01 Less than 8 weeks gestation of pregnancy: Secondary | ICD-10-CM | POA: Insufficient documentation

## 2015-06-26 DIAGNOSIS — Z113 Encounter for screening for infections with a predominantly sexual mode of transmission: Secondary | ICD-10-CM

## 2015-06-26 DIAGNOSIS — D259 Leiomyoma of uterus, unspecified: Secondary | ICD-10-CM | POA: Diagnosis not present

## 2015-06-26 DIAGNOSIS — O09211 Supervision of pregnancy with history of pre-term labor, first trimester: Secondary | ICD-10-CM

## 2015-06-26 DIAGNOSIS — O3411 Maternal care for benign tumor of corpus uteri, first trimester: Secondary | ICD-10-CM | POA: Insufficient documentation

## 2015-06-26 DIAGNOSIS — O3680X1 Pregnancy with inconclusive fetal viability, fetus 1: Secondary | ICD-10-CM

## 2015-06-26 DIAGNOSIS — O09891 Supervision of other high risk pregnancies, first trimester: Secondary | ICD-10-CM | POA: Insufficient documentation

## 2015-06-26 DIAGNOSIS — O09511 Supervision of elderly primigravida, first trimester: Secondary | ICD-10-CM

## 2015-06-26 DIAGNOSIS — O3680X0 Pregnancy with inconclusive fetal viability, not applicable or unspecified: Secondary | ICD-10-CM

## 2015-06-26 DIAGNOSIS — O09521 Supervision of elderly multigravida, first trimester: Secondary | ICD-10-CM

## 2015-06-26 LAB — POCT URINALYSIS DIP (DEVICE)
Bilirubin Urine: NEGATIVE
GLUCOSE, UA: NEGATIVE mg/dL
Ketones, ur: NEGATIVE mg/dL
LEUKOCYTES UA: NEGATIVE
Nitrite: NEGATIVE
PROTEIN: NEGATIVE mg/dL
UROBILINOGEN UA: 0.2 mg/dL (ref 0.0–1.0)
pH: 6 (ref 5.0–8.0)
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
## 2015-06-26 NOTE — Progress Notes (Signed)
FHR not auscultated w/doppler.  Bedside US for viability - unable to confirm viable fetus.  Pt sent to Ultrasound for viability scan

## 2015-06-26 NOTE — Progress Notes (Signed)
Initial prenatal labs today Flu declined

## 2015-06-26 NOTE — Patient Instructions (Signed)
First Trimester of Pregnancy The first trimester of pregnancy is from week 1 until the end of week 12 (months 1 through 3). A week after a sperm fertilizes an egg, the egg will implant on the wall of the uterus. This embryo will begin to develop into a baby. Genes from you and your partner are forming the baby. The female genes determine whether the baby is a boy or a girl. At 6-8 weeks, the eyes and face are formed, and the heartbeat can be seen on ultrasound. At the end of 12 weeks, all the baby's organs are formed.  Now that you are pregnant, you will want to do everything you can to have a healthy baby. Two of the most important things are to get good prenatal care and to follow your health care provider's instructions. Prenatal care is all the medical care you receive before the baby's birth. This care will help prevent, find, and treat any problems during the pregnancy and childbirth. BODY CHANGES Your body goes through many changes during pregnancy. The changes vary from woman to woman.   You may gain or lose a couple of pounds at first.  You may feel sick to your stomach (nauseous) and throw up (vomit). If the vomiting is uncontrollable, call your health care provider.  You may tire easily.  You may develop headaches that can be relieved by medicines approved by your health care provider.  You may urinate more often. Painful urination may mean you have a bladder infection.  You may develop heartburn as a result of your pregnancy.  You may develop constipation because certain hormones are causing the muscles that push waste through your intestines to slow down.  You may develop hemorrhoids or swollen, bulging veins (varicose veins).  Your breasts may begin to grow larger and become tender. Your nipples may stick out more, and the tissue that surrounds them (areola) may become darker.  Your gums may bleed and may be sensitive to brushing and flossing.  Dark spots or blotches (chloasma,  mask of pregnancy) may develop on your face. This will likely fade after the baby is born.  Your menstrual periods will stop.  You may have a loss of appetite.  You may develop cravings for certain kinds of food.  You may have changes in your emotions from day to day, such as being excited to be pregnant or being concerned that something may go wrong with the pregnancy and baby.  You may have more vivid and strange dreams.  You may have changes in your hair. These can include thickening of your hair, rapid growth, and changes in texture. Some women also have hair loss during or after pregnancy, or hair that feels dry or thin. Your hair will most likely return to normal after your baby is born. WHAT TO EXPECT AT YOUR PRENATAL VISITS During a routine prenatal visit:  You will be weighed to make sure you and the baby are growing normally.  Your blood pressure will be taken.  Your abdomen will be measured to track your baby's growth.  The fetal heartbeat will be listened to starting around week 10 or 12 of your pregnancy.  Test results from any previous visits will be discussed. Your health care provider may ask you:  How you are feeling.  If you are feeling the baby move.  If you have had any abnormal symptoms, such as leaking fluid, bleeding, severe headaches, or abdominal cramping.  If you are using any tobacco products,   including cigarettes, chewing tobacco, and electronic cigarettes.  If you have any questions. Other tests that may be performed during your first trimester include:  Blood tests to find your blood type and to check for the presence of any previous infections. They will also be used to check for low iron levels (anemia) and Rh antibodies. Later in the pregnancy, blood tests for diabetes will be done along with other tests if problems develop.  Urine tests to check for infections, diabetes, or protein in the urine.  An ultrasound to confirm the proper growth  and development of the baby.  An amniocentesis to check for possible genetic problems.  Fetal screens for spina bifida and Down syndrome.  You may need other tests to make sure you and the baby are doing well.  HIV (human immunodeficiency virus) testing. Routine prenatal testing includes screening for HIV, unless you choose not to have this test. HOME CARE INSTRUCTIONS  Medicines  Follow your health care provider's instructions regarding medicine use. Specific medicines may be either safe or unsafe to take during pregnancy.  Take your prenatal vitamins as directed.  If you develop constipation, try taking a stool softener if your health care provider approves. Diet  Eat regular, well-balanced meals. Choose a variety of foods, such as meat or vegetable-based protein, fish, milk and low-fat dairy products, vegetables, fruits, and whole grain breads and cereals. Your health care provider will help you determine the amount of weight gain that is right for you.  Avoid raw meat and uncooked cheese. These carry germs that can cause birth defects in the baby.  Eating four or five small meals rather than three large meals a day may help relieve nausea and vomiting. If you start to feel nauseous, eating a few soda crackers can be helpful. Drinking liquids between meals instead of during meals also seems to help nausea and vomiting.  If you develop constipation, eat more high-fiber foods, such as fresh vegetables or fruit and whole grains. Drink enough fluids to keep your urine clear or pale yellow. Activity and Exercise  Exercise only as directed by your health care provider. Exercising will help you:  Control your weight.  Stay in shape.  Be prepared for labor and delivery.  Experiencing pain or cramping in the lower abdomen or low back is a good sign that you should stop exercising. Check with your health care provider before continuing normal exercises.  Try to avoid standing for long  periods of time. Move your legs often if you must stand in one place for a long time.  Avoid heavy lifting.  Wear low-heeled shoes, and practice good posture.  You may continue to have sex unless your health care provider directs you otherwise. Relief of Pain or Discomfort  Wear a good support bra for breast tenderness.   Take warm sitz baths to soothe any pain or discomfort caused by hemorrhoids. Use hemorrhoid cream if your health care provider approves.   Rest with your legs elevated if you have leg cramps or low back pain.  If you develop varicose veins in your legs, wear support hose. Elevate your feet for 15 minutes, 3-4 times a day. Limit salt in your diet. Prenatal Care  Schedule your prenatal visits by the twelfth week of pregnancy. They are usually scheduled monthly at first, then more often in the last 2 months before delivery.  Write down your questions. Take them to your prenatal visits.  Keep all your prenatal visits as directed by your   health care provider. Safety  Wear your seat belt at all times when driving.  Make a list of emergency phone numbers, including numbers for family, friends, the hospital, and police and fire departments. General Tips  Ask your health care provider for a referral to a local prenatal education class. Begin classes no later than at the beginning of month 6 of your pregnancy.  Ask for help if you have counseling or nutritional needs during pregnancy. Your health care provider can offer advice or refer you to specialists for help with various needs.  Do not use hot tubs, steam rooms, or saunas.  Do not douche or use tampons or scented sanitary pads.  Do not cross your legs for long periods of time.  Avoid cat litter boxes and soil used by cats. These carry germs that can cause birth defects in the baby and possibly loss of the fetus by miscarriage or stillbirth.  Avoid all smoking, herbs, alcohol, and medicines not prescribed by  your health care provider. Chemicals in these affect the formation and growth of the baby.  Do not use any tobacco products, including cigarettes, chewing tobacco, and electronic cigarettes. If you need help quitting, ask your health care provider. You may receive counseling support and other resources to help you quit.  Schedule a dentist appointment. At home, brush your teeth with a soft toothbrush and be gentle when you floss. SEEK MEDICAL CARE IF:   You have dizziness.  You have mild pelvic cramps, pelvic pressure, or nagging pain in the abdominal area.  You have persistent nausea, vomiting, or diarrhea.  You have a bad smelling vaginal discharge.  You have pain with urination.  You notice increased swelling in your face, hands, legs, or ankles. SEEK IMMEDIATE MEDICAL CARE IF:   You have a fever.  You are leaking fluid from your vagina.  You have spotting or bleeding from your vagina.  You have severe abdominal cramping or pain.  You have rapid weight gain or loss.  You vomit blood or material that looks like coffee grounds.  You are exposed to [place] measles and have never had them.  You are exposed to fifth disease or chickenpox.  You develop a severe headache.  You have shortness of breath.  You have any kind of trauma, such as from a fall or a car accident.   This information is not intended to replace advice given to you by your health care provider. Make sure you discuss any questions you have with your health care provider.   Document Released: 04/14/2001 Document Revised: 05/11/2014 Document Reviewed: 02/28/2013 Elsevier Interactive Patient Education 2016 [place] Medications in Pregnancy   Acne: Benzoyl Peroxide Salicylic Acid  Backache/Headache: Tylenol: 2 regular strength every 4 hours OR              2 Extra strength every 6 hours  Colds/Coughs/Allergies: Benadryl (alcohol free) 25 mg every 6 hours as needed Breath right  strips Claritin Cepacol throat lozenges Chloraseptic throat spray Cold-Eeze- up to three times per day Cough drops, alcohol free Flonase (by prescription only) Guaifenesin Mucinex Robitussin DM (plain only, alcohol free) Saline nasal spray/drops Sudafed (pseudoephedrine) & Actifed ** use only after [redacted] weeks gestation and if you do not have high blood pressure Tylenol Vicks Vaporub Zinc lozenges Zyrtec   Constipation: Colace Ducolax suppositories Fleet enema Glycerin suppositories Metamucil Milk of magnesia Miralax Senokot Smooth move tea  Diarrhea: Kaopectate Imodium A-D  *NO pepto Bismol  Hemorrhoids: Anusol Anusol HC Preparation H Tucks  Indigestion: Tums Maalox Mylanta Zantac  Pepcid  Insomnia: Benadryl (alcohol free) 25mg  every 6 hours as needed Tylenol PM Unisom, no Gelcaps  Leg Cramps: Tums MagGel  Nausea/Vomiting:  Bonine Dramamine Emetrol Ginger extract Sea bands Meclizine  Nausea medication to take during pregnancy:  Unisom (doxylamine succinate 25 mg tablets) Take one tablet daily at bedtime. If symptoms are not adequately controlled, the dose can be increased to a maximum recommended dose of two tablets daily (1/2 tablet in the morning, 1/2 tablet mid-afternoon and one at bedtime). Vitamin B6 100mg  tablets. Take one tablet twice a day (up to 200 mg per day).  Skin Rashes: Aveeno products Benadryl cream or 25mg  every 6 hours as needed Calamine Lotion 1% cortisone cream  Yeast infection: Gyne-lotrimin 7 Monistat 7  Gum/tooth pain: Anbesol  **If taking multiple medications, please check labels to avoid duplicating the same active ingredients **take medication as directed on the label ** Do not exceed 4000 mg of tylenol in 24 hours **Do not take medications that contain aspirin or ibuprofen

## 2015-06-27 LAB — GC/CHLAMYDIA PROBE AMP (~~LOC~~) NOT AT ARMC
Chlamydia: NEGATIVE
Neisseria Gonorrhea: NEGATIVE

## 2015-06-27 LAB — PRENATAL PROFILE (SOLSTAS)
ANTIBODY SCREEN: NEGATIVE
Basophils Absolute: 0 10*3/uL (ref 0.0–0.1)
Basophils Relative: 0 % (ref 0–1)
EOS ABS: 0.1 10*3/uL (ref 0.0–0.7)
EOS PCT: 2 % (ref 0–5)
HCT: 36.5 % (ref 36.0–46.0)
HIV 1&2 Ab, 4th Generation: NONREACTIVE
Hemoglobin: 11.7 g/dL — ABNORMAL LOW (ref 12.0–15.0)
Hepatitis B Surface Ag: NEGATIVE
LYMPHS ABS: 1.5 10*3/uL (ref 0.7–4.0)
Lymphocytes Relative: 32 % (ref 12–46)
MCH: 28.5 pg (ref 26.0–34.0)
MCHC: 32.1 g/dL (ref 30.0–36.0)
MCV: 89 fL (ref 78.0–100.0)
MONOS PCT: 8 % (ref 3–12)
MPV: 10.6 fL (ref 8.6–12.4)
Monocytes Absolute: 0.4 10*3/uL (ref 0.1–1.0)
NEUTROS PCT: 58 % (ref 43–77)
Neutro Abs: 2.8 10*3/uL (ref 1.7–7.7)
PLATELETS: 259 10*3/uL (ref 150–400)
RBC: 4.1 MIL/uL (ref 3.87–5.11)
RDW: 13.5 % (ref 11.5–15.5)
RUBELLA: 18.5 {index} — AB (ref <0.90)
Rh Type: POSITIVE
WBC: 4.8 10*3/uL (ref 4.0–10.5)

## 2015-06-28 LAB — PRESCRIPTION MONITORING PROFILE (19 PANEL)
AMPHETAMINE/METH: NEGATIVE ng/mL
BARBITURATE SCREEN, URINE: NEGATIVE ng/mL
BUPRENORPHINE, URINE: NEGATIVE ng/mL
Benzodiazepine Screen, Urine: NEGATIVE ng/mL
Cannabinoid Scrn, Ur: NEGATIVE ng/mL
Carisoprodol, Urine: NEGATIVE ng/mL
Cocaine Metabolites: NEGATIVE ng/mL
Creatinine, Urine: 508.63 mg/dL (ref >20.0)
Fentanyl, Ur: NEGATIVE ng/mL
MDMA URINE: NEGATIVE ng/mL
MEPERIDINE UR: NEGATIVE ng/mL
METHADONE SCREEN, URINE: NEGATIVE ng/mL
METHAQUALONE SCREEN (URINE): NEGATIVE ng/mL
NITRITES URINE, INITIAL: NEGATIVE ug/mL
OPIATE SCREEN, URINE: NEGATIVE ng/mL
Oxycodone Screen, Ur: NEGATIVE ng/mL
PROPOXYPHENE: NEGATIVE ng/mL
Phencyclidine, Ur: NEGATIVE ng/mL
TAPENTADOLUR: NEGATIVE ng/mL
Tramadol Scrn, Ur: NEGATIVE ng/mL
ZOLPIDEM, URINE: NEGATIVE ng/mL
pH, Initial: 5.8 pH (ref 4.5–8.9)

## 2015-06-28 LAB — HEMOGLOBINOPATHY EVALUATION
HGB A: 97.2 % (ref 96.8–97.8)
HGB S QUANTITAION: 0 %
Hemoglobin Other: 0 %
Hgb A2 Quant: 2.8 % (ref 2.2–3.2)
Hgb F Quant: 0 % (ref 0.0–2.0)

## 2015-06-28 LAB — CULTURE, OB URINE
Colony Count: NO GROWTH
ORGANISM ID, BACTERIA: NO GROWTH

## 2015-07-01 ENCOUNTER — Telehealth: Payer: Self-pay | Admitting: *Deleted

## 2015-07-01 NOTE — Telephone Encounter (Signed)
Pt left message stating that she would like to speak to a nurse. She had her second prenatal visit last week and is now experiencing heavy bleeding. I called pt and discussed her concern. She stated that the bleeding began on 2/24 and was light. It became heavy on 2/25 and has continued to be heavy. I advised pt that she requires evaluation in [facility] as she may be having a miscarriage. Pt voiced understanding and agreed to go to [facility] today.

## 2015-07-10 ENCOUNTER — Ambulatory Visit (HOSPITAL_COMMUNITY): Payer: BLUE CROSS/BLUE SHIELD

## 2015-07-10 ENCOUNTER — Encounter: Payer: Self-pay | Admitting: Advanced Practice Midwife

## 2015-07-10 ENCOUNTER — Encounter: Payer: Self-pay | Admitting: Obstetrics & Gynecology

## 2015-07-23 ENCOUNTER — Encounter: Payer: Self-pay | Admitting: Certified Nurse Midwife

## 2015-07-24 ENCOUNTER — Encounter: Payer: Self-pay | Admitting: Family

## 2015-07-24 ENCOUNTER — Ambulatory Visit (INDEPENDENT_AMBULATORY_CARE_PROVIDER_SITE_OTHER): Payer: BLUE CROSS/BLUE SHIELD | Admitting: Family

## 2015-07-24 VITALS — BP 111/43 | HR 83 | Temp 98.8°F | Wt 167.3 lb

## 2015-07-24 DIAGNOSIS — D259 Leiomyoma of uterus, unspecified: Secondary | ICD-10-CM | POA: Diagnosis not present

## 2015-07-24 LAB — POCT PREGNANCY, URINE: Preg Test, Ur: NEGATIVE
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
## 2015-07-30 ENCOUNTER — Ambulatory Visit (HOSPITAL_COMMUNITY): Payer: BLUE CROSS/BLUE SHIELD

## 2015-07-31 ENCOUNTER — Ambulatory Visit (HOSPITAL_COMMUNITY)
Admission: RE | Admit: 2015-07-31 | Discharge: 2015-07-31 | Disposition: A | Discharge status: Home / Self Care | Payer: BLUE CROSS/BLUE SHIELD | Source: Ambulatory Visit | Attending: Family | Admitting: Family

## 2015-07-31 DIAGNOSIS — D259 Leiomyoma of uterus, unspecified: Secondary | ICD-10-CM | POA: Diagnosis not present

## 2015-08-15 ENCOUNTER — Ambulatory Visit: Payer: Self-pay | Admitting: Gynecology

## 2015-08-21 ENCOUNTER — Ambulatory Visit: Payer: Self-pay | Admitting: Obstetrics & Gynecology

## 2015-08-28 ENCOUNTER — Ambulatory Visit: Payer: Self-pay | Admitting: Women's Health

## 2015-08-28 DIAGNOSIS — Z0289 Encounter for other administrative examinations: Secondary | ICD-10-CM

## 2015-10-23 ENCOUNTER — Ambulatory Visit: Payer: Self-pay | Admitting: Obstetrics & Gynecology

## 2018-02-24 ENCOUNTER — Encounter: Payer: Self-pay | Admitting: *Deleted

## 2019-01-24 ENCOUNTER — Encounter: Payer: Self-pay | Admitting: Gynecology

## 2021-05-19 ENCOUNTER — Emergency Department (HOSPITAL_BASED_OUTPATIENT_CLINIC_OR_DEPARTMENT_OTHER)
Admission: EM | Admit: 2021-05-19 | Discharge: 2021-05-19 | Disposition: A | Discharge status: Home / Self Care | Payer: Self-pay | Attending: Emergency Medicine | Admitting: Emergency Medicine

## 2021-05-19 ENCOUNTER — Emergency Department (HOSPITAL_BASED_OUTPATIENT_CLINIC_OR_DEPARTMENT_OTHER): Payer: Self-pay

## 2021-05-19 ENCOUNTER — Other Ambulatory Visit: Payer: Self-pay

## 2021-05-19 ENCOUNTER — Encounter (HOSPITAL_BASED_OUTPATIENT_CLINIC_OR_DEPARTMENT_OTHER): Payer: Self-pay

## 2021-05-19 DIAGNOSIS — Z79899 Other long term (current) drug therapy: Secondary | ICD-10-CM | POA: Insufficient documentation

## 2021-05-19 DIAGNOSIS — M542 Cervicalgia: Secondary | ICD-10-CM | POA: Insufficient documentation

## 2021-05-19 DIAGNOSIS — S12001A Unspecified nondisplaced fracture of first cervical vertebra, initial encounter for closed fracture: Secondary | ICD-10-CM

## 2021-05-19 MED ORDER — ACETAMINOPHEN 325 MG PO TABS
650.0000 mg | ORAL_TABLET | Freq: Once | ORAL | Status: AC
  Administered 2021-05-19: 650 mg via ORAL
  Filled 2021-05-19: qty 2

## 2021-05-19 MED ORDER — HYDROCODONE-ACETAMINOPHEN 5-325 MG PO TABS
1.0000 | ORAL_TABLET | Freq: Four times a day (QID) | ORAL | 0 refills | Status: DC | PRN
Start: 2021-05-19 — End: 2021-08-18
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
## 2021-05-19 NOTE — ED Notes (Signed)
Pt transported to CT ?
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
## 2021-05-19 NOTE — Discharge Instructions (Addendum)
Please follow up with Dr. Arnoldo Morale. Keep the cervical collar on until you are seen by neurosurgery.  Please call tomorrow to make an appointment in 2 weeks or sooner.  Please use Tylenol or ibuprofen for pain.  You may use 600 mg ibuprofen every 6 hours or 1000 mg of Tylenol every 6 hours.  You may choose to alternate between the 2.  This would be most effective.  Not to exceed 4 g of Tylenol within 24 hours.  Not to exceed 3200 mg ibuprofen 24 hours.   I have prescribed you a tablets of Norco notably this does contain Tylenol so if you do take a Norco please skip 1 dose of Tylenol.

## 2021-05-19 NOTE — ED Notes (Signed)
[place], [place] not ready for pt to be discharged
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
## 2021-08-18 ENCOUNTER — Emergency Department (INDEPENDENT_AMBULATORY_CARE_PROVIDER_SITE_OTHER): Payer: Self-pay

## 2021-08-18 ENCOUNTER — Emergency Department (INDEPENDENT_AMBULATORY_CARE_PROVIDER_SITE_OTHER)
Admission: EM | Admit: 2021-08-18 | Discharge: 2021-08-18 | Disposition: A | Discharge status: Home / Self Care | Payer: Self-pay | Source: Home / Self Care | Attending: Family Medicine | Admitting: Family Medicine

## 2021-08-18 DIAGNOSIS — S20219A Contusion of unspecified front wall of thorax, initial encounter: Secondary | ICD-10-CM

## 2021-08-18 DIAGNOSIS — S20213A Contusion of bilateral front wall of thorax, initial encounter: Secondary | ICD-10-CM

## 2021-08-18 DIAGNOSIS — R072 Precordial pain: Secondary | ICD-10-CM

## 2021-08-18 DIAGNOSIS — W109XXA Fall (on) (from) unspecified stairs and steps, initial encounter: Secondary | ICD-10-CM

## 2021-08-18 DIAGNOSIS — R0781 Pleurodynia: Secondary | ICD-10-CM

## 2021-08-18 MED ORDER — HYDROCODONE-ACETAMINOPHEN 5-325 MG PO TABS: 1.0000 | ORAL_TABLET | Freq: Four times a day (QID) | ORAL | 0 refills | Status: AC | PRN

## 2021-08-18 NOTE — ED Triage Notes (Signed)
Pt presents to Urgent Care with c/o R upper back pain and sob following a fall yesterday. Reports falling and hitting R upper back against a door. Skin abrasion noted to this area. Pt also reports that pain radiates to her R chest. ?
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
## 2021-08-18 NOTE — Discharge Instructions (Addendum)
Apply ice pack for 20 to 30 minutes, 3 to 4 times daily  Continue until pain and swelling decrease.  ? ?If symptoms become significantly worse during the night or over the weekend, proceed to the local emergency room.  ?

## 2021-09-22 DIAGNOSIS — Z708 Other sex counseling: Secondary | ICD-10-CM | POA: Diagnosis not present

## 2021-12-05 DIAGNOSIS — Z113 Encounter for screening for infections with a predominantly sexual mode of transmission: Secondary | ICD-10-CM | POA: Diagnosis not present

## 2021-12-05 DIAGNOSIS — Z8759 Personal history of other complications of pregnancy, childbirth and the puerperium: Secondary | ICD-10-CM | POA: Diagnosis not present

## 2021-12-05 DIAGNOSIS — R102 Pelvic and perineal pain: Secondary | ICD-10-CM | POA: Diagnosis not present

## 2021-12-05 DIAGNOSIS — Z98891 History of uterine scar from previous surgery: Secondary | ICD-10-CM | POA: Diagnosis not present

## 2021-12-05 DIAGNOSIS — R69 Illness, unspecified: Secondary | ICD-10-CM | POA: Diagnosis not present

## 2021-12-05 DIAGNOSIS — N72 Inflammatory disease of cervix uteri: Secondary | ICD-10-CM | POA: Diagnosis not present

## 2021-12-05 DIAGNOSIS — N920 Excessive and frequent menstruation with regular cycle: Secondary | ICD-10-CM | POA: Diagnosis not present

## 2021-12-05 DIAGNOSIS — Z01419 Encounter for gynecological examination (general) (routine) without abnormal findings: Secondary | ICD-10-CM | POA: Diagnosis not present

## 2021-12-05 DIAGNOSIS — Z118 Encounter for screening for other infectious and parasitic diseases: Secondary | ICD-10-CM | POA: Diagnosis not present

## 2021-12-05 DIAGNOSIS — Z01411 Encounter for gynecological examination (general) (routine) with abnormal findings: Secondary | ICD-10-CM | POA: Diagnosis not present

## 2022-01-14 DIAGNOSIS — R69 Illness, unspecified: Secondary | ICD-10-CM | POA: Diagnosis not present

## 2022-01-14 DIAGNOSIS — R102 Pelvic and perineal pain: Secondary | ICD-10-CM | POA: Diagnosis not present

## 2022-07-09 DIAGNOSIS — E288 Other ovarian dysfunction: Secondary | ICD-10-CM | POA: Diagnosis not present

## 2022-07-13 DIAGNOSIS — Z3169 Encounter for other general counseling and advice on procreation: Secondary | ICD-10-CM | POA: Diagnosis not present

## 2022-08-07 DIAGNOSIS — E349 Endocrine disorder, unspecified: Secondary | ICD-10-CM | POA: Diagnosis not present

## 2022-08-07 DIAGNOSIS — N259 Disorder resulting from impaired renal tubular function, unspecified: Secondary | ICD-10-CM | POA: Diagnosis not present

## 2022-08-07 DIAGNOSIS — Z8619 Personal history of other infectious and parasitic diseases: Secondary | ICD-10-CM | POA: Diagnosis not present

## 2022-08-07 DIAGNOSIS — Z1329 Encounter for screening for other suspected endocrine disorder: Secondary | ICD-10-CM | POA: Diagnosis not present

## 2022-08-07 DIAGNOSIS — R7989 Other specified abnormal findings of blood chemistry: Secondary | ICD-10-CM | POA: Diagnosis not present

## 2022-08-07 DIAGNOSIS — E229 Hyperfunction of pituitary gland, unspecified: Secondary | ICD-10-CM | POA: Diagnosis not present

## 2022-08-07 DIAGNOSIS — R891 Abnormal level of hormones in specimens from other organs, systems and tissues: Secondary | ICD-10-CM | POA: Diagnosis not present

## 2022-08-07 DIAGNOSIS — R6889 Other general symptoms and signs: Secondary | ICD-10-CM | POA: Diagnosis not present

## 2022-08-07 DIAGNOSIS — E288 Other ovarian dysfunction: Secondary | ICD-10-CM | POA: Diagnosis not present

## 2022-08-07 DIAGNOSIS — Z131 Encounter for screening for diabetes mellitus: Secondary | ICD-10-CM | POA: Diagnosis not present

## 2022-08-07 DIAGNOSIS — Z113 Encounter for screening for infections with a predominantly sexual mode of transmission: Secondary | ICD-10-CM | POA: Diagnosis not present

## 2022-08-07 DIAGNOSIS — Z1159 Encounter for screening for other viral diseases: Secondary | ICD-10-CM | POA: Diagnosis not present

## 2022-08-07 DIAGNOSIS — Z0183 Encounter for blood typing: Secondary | ICD-10-CM | POA: Diagnosis not present

## 2022-09-23 DIAGNOSIS — Z1239 Encounter for other screening for malignant neoplasm of breast: Secondary | ICD-10-CM | POA: Diagnosis not present

## 2022-09-23 DIAGNOSIS — Z1231 Encounter for screening mammogram for malignant neoplasm of breast: Secondary | ICD-10-CM | POA: Diagnosis not present

## 2022-09-29 DIAGNOSIS — N979 Female infertility, unspecified: Secondary | ICD-10-CM | POA: Diagnosis not present

## 2022-09-29 DIAGNOSIS — Z01810 Encounter for preprocedural cardiovascular examination: Secondary | ICD-10-CM | POA: Diagnosis not present

## 2022-10-01 DIAGNOSIS — N979 Female infertility, unspecified: Secondary | ICD-10-CM | POA: Diagnosis not present
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
## 2023-08-07 IMAGING — CT CT CERVICAL SPINE W/O CM
5 series · 14 of 35 positions shown, 16 images · non-contrast
Comparison: Head CT May 19, 2021

CLINICAL DATA: Assess for C1 fracture



[Series 3: c spine soft · axial · 0.44mm/px · z∈[-289,-255]mm · 2 of 88 slices shown]
[im 18/88  soft-tissue]
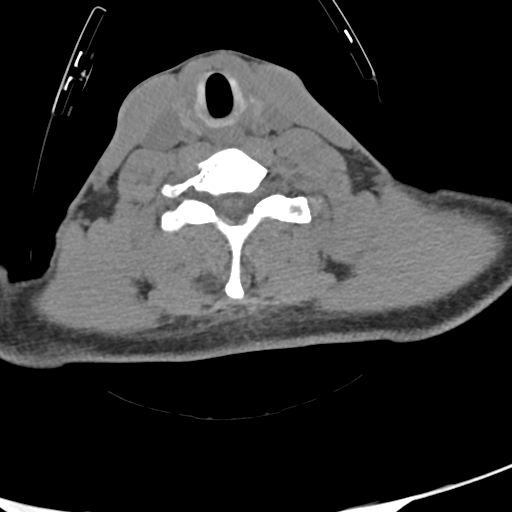
[im 35/88  soft-tissue]
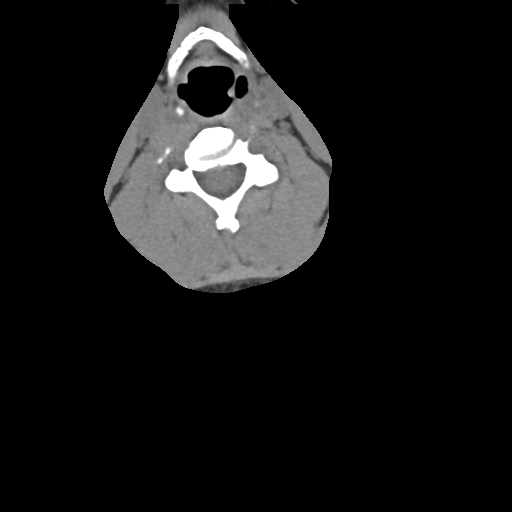

[Series 5: sagittal bone · sagittal · 0.33mm/px · 5 of 61 slices shown, 6 images]
[im 21/61  bone]
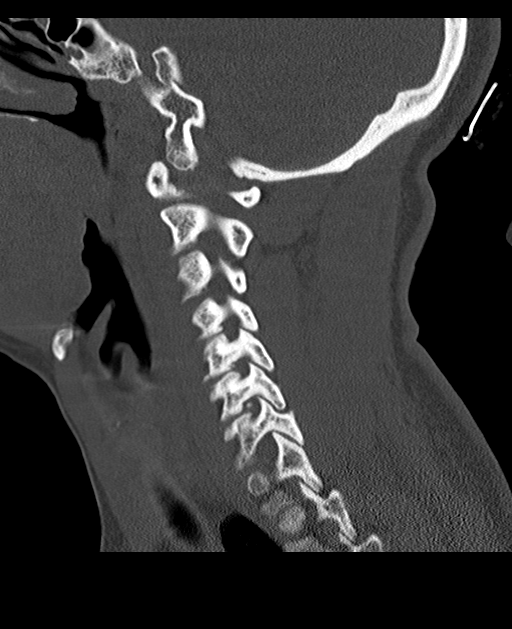
[im 26/61  bone]
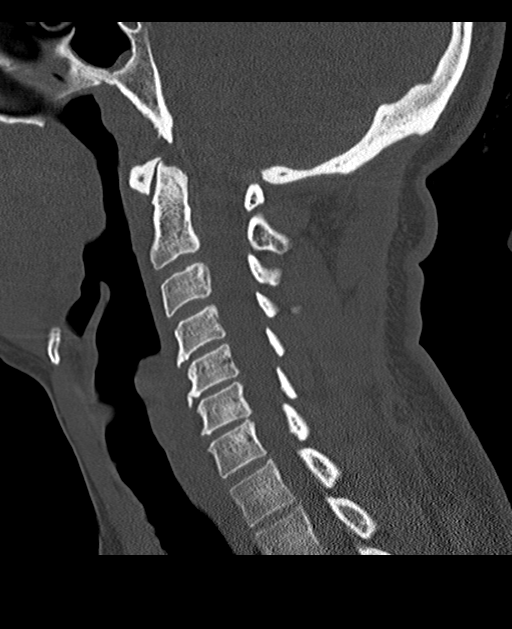
[im 31/61  soft-tissue]
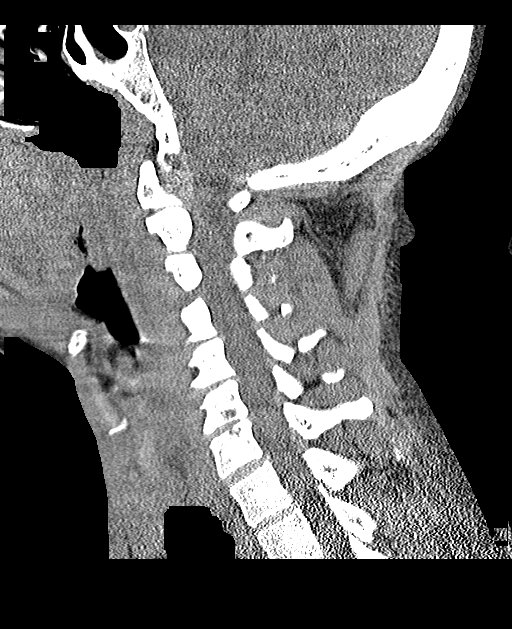
[im 31/61  bone]
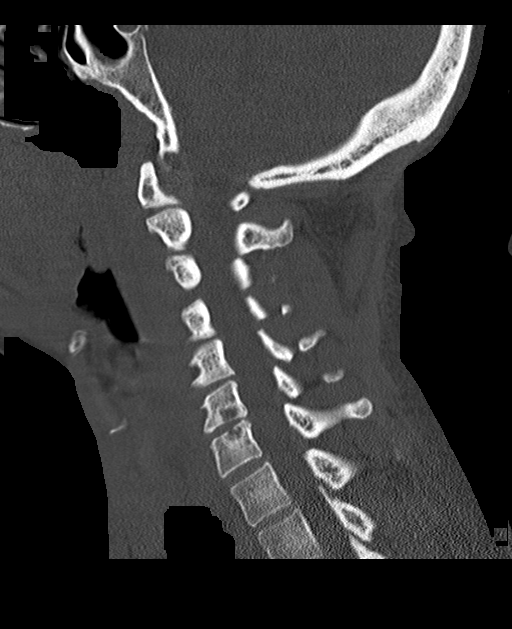
[im 36/61  bone]
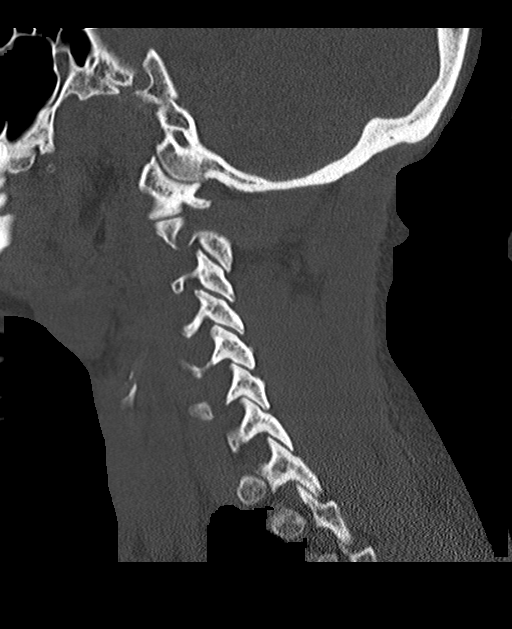
[im 41/61  bone]
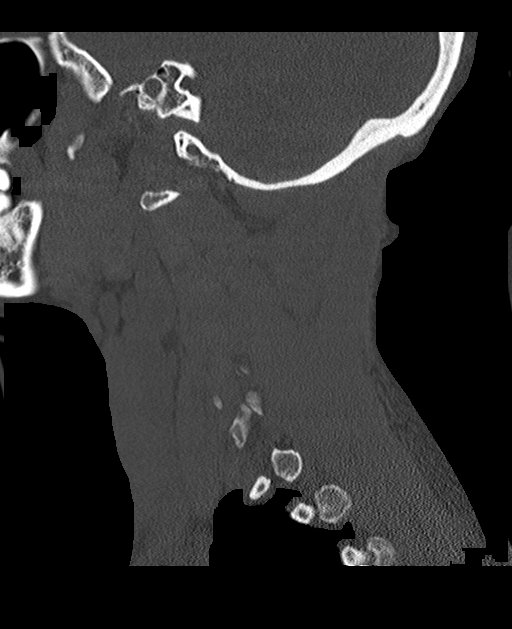

[Series 6: coronal bone · coronal · 0.29mm/px · 3 of 61 slices shown]
[im 13/61  bone]
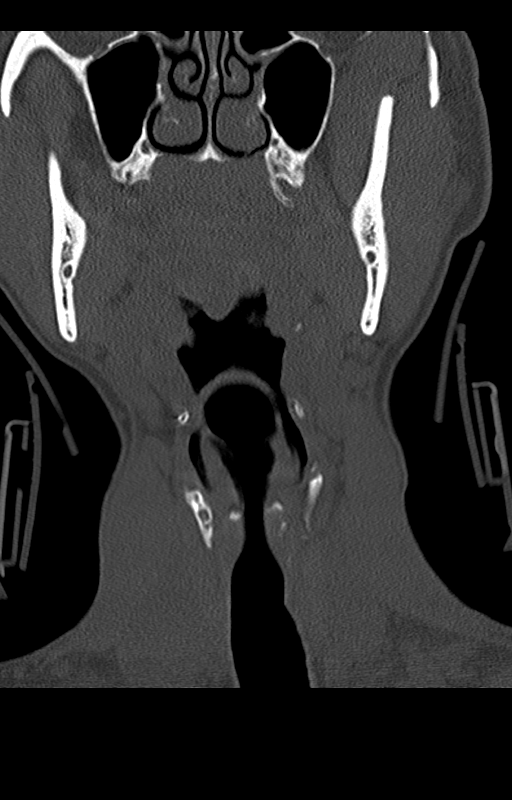
[im 25/61  bone]
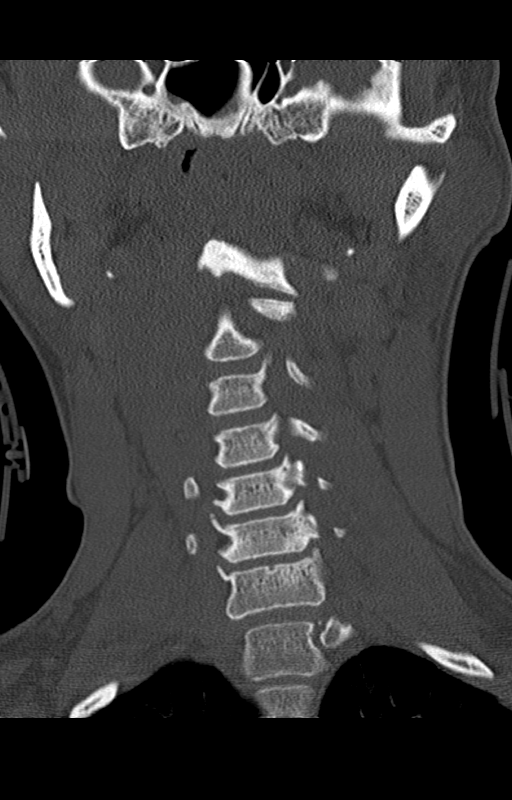
[im 37/61  bone]
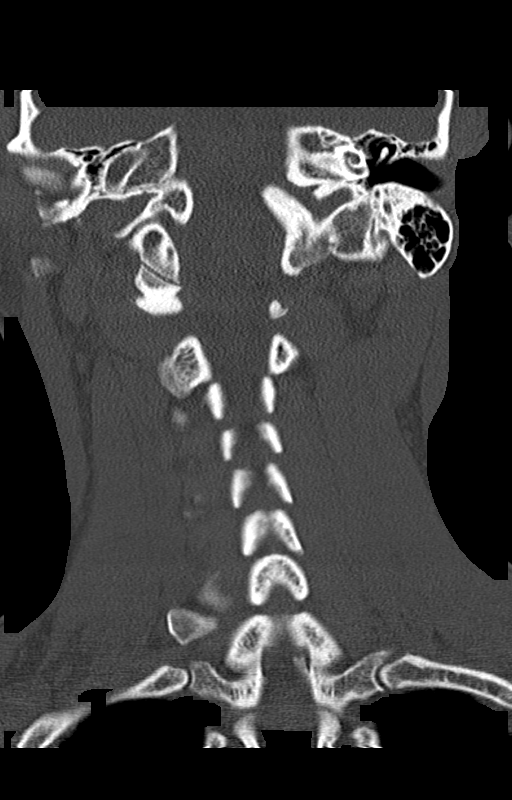

[Series 7: orthogonal bone · axial · 0.23mm/px · z∈[-256,-216]mm · 2 of 63 slices shown, 3 images (1 of 2)]
[im 21/63  soft-tissue]
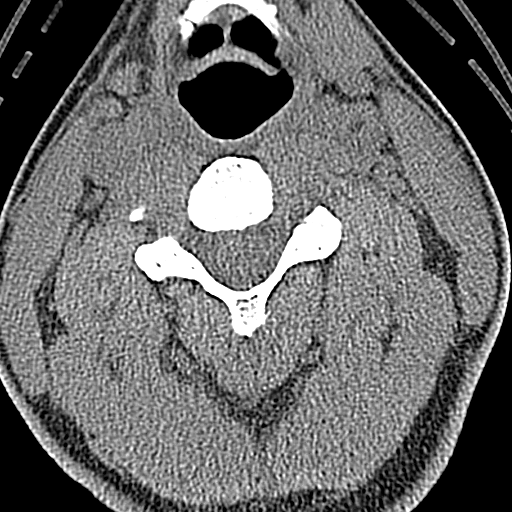
[im 21/63  bone]
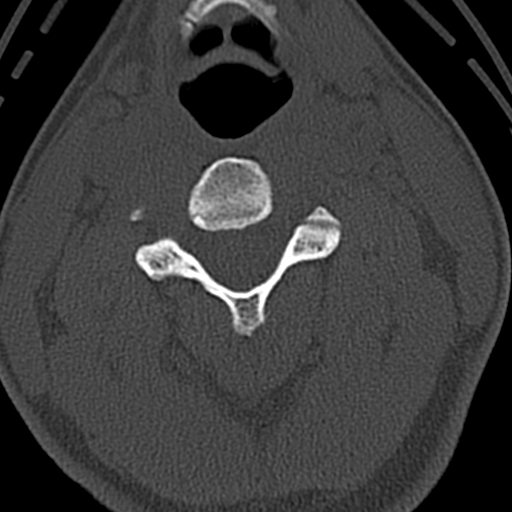
[im 42/63  bone]
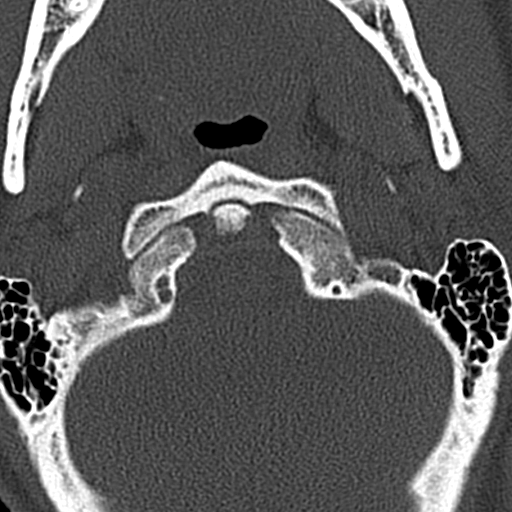

[Series 8: orthogonal bone · axial · 0.23mm/px · z∈[-325,-287]mm · 2 of 63 slices shown (2 of 2)]
[im 21/63  bone]
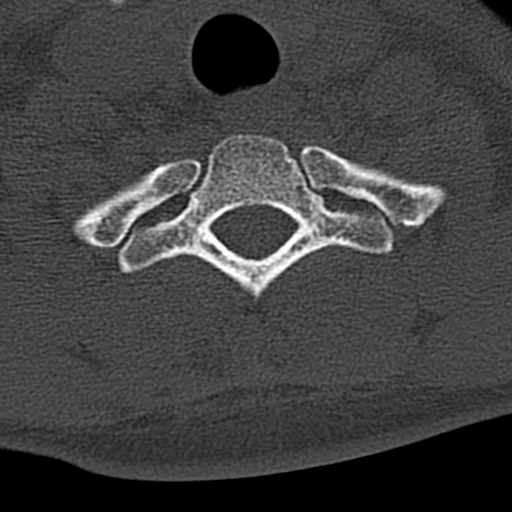
[im 42/63  bone]
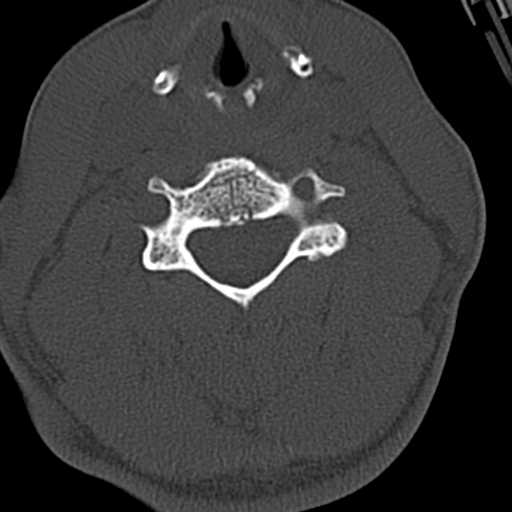

[14 of 35 positions shown; findings below may reference images not displayed]

FINDINGS: Alignment: Normal.

Skull base and vertebrae: Left C1 ring defect seen on head CT
appears chronic on cervical spine CT. No acute fracture or
dislocation is identified in the cervical spine.

Soft tissues and spinal canal: No prevertebral fluid or swelling. No
visible canal hematoma.

Disc levels: Mild narrow joint spaces with anterior osteophytosis
are identified in the mid to lower cervical spine.

Upper chest: Negative.

Other: None.
IMPRESSION: Left C1 ring defect seen on head CT appears chronic on cervical
spine CT. No acute fracture or dislocation is identified in the
cervical spine.

These results were called by telephone at the time of interpretation
on 05/19/2021 at [DATE] to provider ROFRAMAR GOVE , who verbally
acknowledged these results.
